# Patient Record
Sex: Female | Born: 1935 | Race: White | Hispanic: No | Smoking: Never smoker
Health system: Southern US, Community
[De-identification: ages and names within clinical notes are randomized; demographics above are authoritative.]

## PROBLEM LIST (undated history)

## (undated) DIAGNOSIS — I10 Essential (primary) hypertension: Secondary | ICD-10-CM

## (undated) DIAGNOSIS — E7800 Pure hypercholesterolemia, unspecified: Secondary | ICD-10-CM

## (undated) DIAGNOSIS — K219 Gastro-esophageal reflux disease without esophagitis: Secondary | ICD-10-CM

## (undated) DIAGNOSIS — R072 Precordial pain: Secondary | ICD-10-CM

## (undated) DIAGNOSIS — Z8249 Family history of ischemic heart disease and other diseases of the circulatory system: Secondary | ICD-10-CM

## (undated) DIAGNOSIS — J309 Allergic rhinitis, unspecified: Secondary | ICD-10-CM

## (undated) DIAGNOSIS — Z8719 Personal history of other diseases of the digestive system: Secondary | ICD-10-CM

## (undated) DIAGNOSIS — C50919 Malignant neoplasm of unspecified site of unspecified female breast: Secondary | ICD-10-CM

## (undated) DIAGNOSIS — I2510 Atherosclerotic heart disease of native coronary artery without angina pectoris: Secondary | ICD-10-CM

## (undated) DIAGNOSIS — I509 Heart failure, unspecified: Secondary | ICD-10-CM

## (undated) DIAGNOSIS — I252 Old myocardial infarction: Secondary | ICD-10-CM

## (undated) DIAGNOSIS — E876 Hypokalemia: Secondary | ICD-10-CM

## (undated) DIAGNOSIS — I82409 Acute embolism and thrombosis of unspecified deep veins of unspecified lower extremity: Secondary | ICD-10-CM

## (undated) DIAGNOSIS — H3530 Unspecified macular degeneration: Secondary | ICD-10-CM

## (undated) HISTORY — PX: CATARACT EXTRACTION, BILATERAL: SHX1313

## (undated) HISTORY — DX: Atherosclerotic heart disease of native coronary artery without angina pectoris: I25.10

## (undated) HISTORY — DX: Gastro-esophageal reflux disease without esophagitis: K21.9

## (undated) HISTORY — DX: Hypokalemia: E87.6

## (undated) HISTORY — PX: ABDOMINAL HYSTERECTOMY: SHX81

## (undated) HISTORY — DX: Allergic rhinitis, unspecified: J30.9

## (undated) HISTORY — DX: Family history of ischemic heart disease and other diseases of the circulatory system: Z82.49

## (undated) HISTORY — DX: Malignant neoplasm of unspecified site of unspecified female breast: C50.919

## (undated) HISTORY — PX: LUMBAR LAMINECTOMY: SHX95

## (undated) HISTORY — DX: Pure hypercholesterolemia, unspecified: E78.00

## (undated) HISTORY — DX: Acute embolism and thrombosis of unspecified deep veins of unspecified lower extremity: I82.409

## (undated) HISTORY — PX: OTHER SURGICAL HISTORY: SHX169

## (undated) HISTORY — DX: Essential (primary) hypertension: I10

## (undated) HISTORY — DX: Unspecified macular degeneration: H35.30

## (undated) HISTORY — DX: Old myocardial infarction: I25.2

## (undated) HISTORY — DX: Precordial pain: R07.2

---

## 2000-04-15 HISTORY — PX: CARDIAC CATHETERIZATION: SHX172

## 2000-06-23 ENCOUNTER — Inpatient Hospital Stay (HOSPITAL_COMMUNITY): Admission: AD | Admit: 2000-06-23 | Discharge: 2000-06-25 | Payer: Self-pay | Admitting: Internal Medicine

## 2000-06-24 ENCOUNTER — Encounter: Payer: Self-pay | Admitting: Internal Medicine

## 2000-06-25 ENCOUNTER — Encounter: Payer: Self-pay | Admitting: Internal Medicine

## 2000-07-14 DIAGNOSIS — I2510 Atherosclerotic heart disease of native coronary artery without angina pectoris: Secondary | ICD-10-CM

## 2000-07-14 HISTORY — DX: Atherosclerotic heart disease of native coronary artery without angina pectoris: I25.10

## 2002-10-25 ENCOUNTER — Ambulatory Visit (HOSPITAL_COMMUNITY): Admission: RE | Admit: 2002-10-25 | Discharge: 2002-10-25 | Payer: Self-pay | Admitting: Internal Medicine

## 2004-04-15 DIAGNOSIS — I82409 Acute embolism and thrombosis of unspecified deep veins of unspecified lower extremity: Secondary | ICD-10-CM

## 2004-04-15 DIAGNOSIS — C50919 Malignant neoplasm of unspecified site of unspecified female breast: Secondary | ICD-10-CM

## 2004-04-15 HISTORY — DX: Acute embolism and thrombosis of unspecified deep veins of unspecified lower extremity: I82.409

## 2004-04-15 HISTORY — DX: Malignant neoplasm of unspecified site of unspecified female breast: C50.919

## 2005-09-17 ENCOUNTER — Ambulatory Visit (HOSPITAL_COMMUNITY): Admission: RE | Admit: 2005-09-17 | Discharge: 2005-09-17 | Payer: Self-pay | Admitting: Internal Medicine

## 2005-09-17 ENCOUNTER — Encounter (INDEPENDENT_AMBULATORY_CARE_PROVIDER_SITE_OTHER): Payer: Self-pay | Admitting: *Deleted

## 2005-09-17 ENCOUNTER — Ambulatory Visit: Payer: Self-pay | Admitting: Internal Medicine

## 2005-09-17 HISTORY — PX: COLONOSCOPY: SHX174

## 2009-04-18 ENCOUNTER — Encounter: Payer: Self-pay | Admitting: Cardiology

## 2009-04-18 ENCOUNTER — Ambulatory Visit (HOSPITAL_COMMUNITY): Admission: RE | Admit: 2009-04-18 | Discharge: 2009-04-18 | Payer: Self-pay | Admitting: Urology

## 2010-02-13 HISTORY — PX: TOTAL HIP ARTHROPLASTY: SHX124

## 2010-02-28 ENCOUNTER — Encounter: Payer: Self-pay | Admitting: Physician Assistant

## 2010-03-01 ENCOUNTER — Ambulatory Visit: Payer: Self-pay | Admitting: Cardiology

## 2010-03-01 ENCOUNTER — Encounter: Payer: Self-pay | Admitting: Physician Assistant

## 2010-03-05 ENCOUNTER — Encounter: Payer: Self-pay | Admitting: Physician Assistant

## 2010-03-14 ENCOUNTER — Encounter: Payer: Self-pay | Admitting: Physician Assistant

## 2010-03-29 ENCOUNTER — Encounter: Payer: Self-pay | Admitting: Physician Assistant

## 2010-03-30 ENCOUNTER — Encounter: Payer: Self-pay | Admitting: Physician Assistant

## 2010-04-01 ENCOUNTER — Encounter: Payer: Self-pay | Admitting: Physician Assistant

## 2010-04-02 ENCOUNTER — Encounter: Payer: Self-pay | Admitting: Physician Assistant

## 2010-04-03 ENCOUNTER — Ambulatory Visit: Payer: Self-pay | Admitting: Physician Assistant

## 2010-04-04 ENCOUNTER — Encounter: Payer: Self-pay | Admitting: Physician Assistant

## 2010-04-27 ENCOUNTER — Encounter: Payer: Self-pay | Admitting: Physician Assistant

## 2010-04-27 DIAGNOSIS — I238 Other current complications following acute myocardial infarction: Secondary | ICD-10-CM | POA: Insufficient documentation

## 2010-04-27 DIAGNOSIS — I2510 Atherosclerotic heart disease of native coronary artery without angina pectoris: Secondary | ICD-10-CM | POA: Insufficient documentation

## 2010-04-27 DIAGNOSIS — D649 Anemia, unspecified: Secondary | ICD-10-CM | POA: Insufficient documentation

## 2010-05-01 ENCOUNTER — Encounter: Payer: Self-pay | Admitting: Physician Assistant

## 2010-05-17 NOTE — Assessment & Plan Note (Signed)
Summary: [facility]-POST [facility] PER [facility] DAY WITH [facility]   Visit Type:  Follow-up Primary Provider:  Dr. Samuel Jester   History of Present Illness: patient presents for posthospital followup.  She was referred to Dr. Andee Lineman and myself back in November, for question of possible small LV apical thrombus. She had presented status post fall, with fracture of the right hip, and underwent surgery with no complications. Nevertheless, a 2-D echo was obtained, indicating normal LVF (EF 50-55% Preventive Screening-Counseling & Management  Alcohol-Tobacco     Smoking Status: never  Current Medications (verified): 1)  Ativan 0.5 Mg Tabs (Lorazepam) .... Take 1 Tablet By Mouth Two Times A Day As Needed 2)  Potassium Chloride Crys Cr 20 Meq Cr-Tabs (Potassium Chloride Crys Cr) .... Take 1 Tablet By Mouth Once A Day 3)  Furosemide 20 Mg Tabs (Furosemide) .... Take One Tablet By Mouth Daily. 4)  Vitamin D 1000 Unit Tabs (Cholecalciferol) .... Take 1 Tablet By Mouth Once A Day 5)  Multivitamins  Tabs (Multiple Vitamin) .... Take 1 Tablet By Mouth Once A Day 6)  Colace 100 Mg Caps (Docusate Sodium) .... Take 1 Capsule By Mouth Two Times A Day 7)  Miralax  Powd (Polyethylene Glycol 3350) .Marland KitchenMarland KitchenMarland Kitchen 17 Grams By Mouth Once Daily. 8)  Oscal 500/200 D-3 500-200 Mg-Unit Tabs (Calcium Carbonate-Vitamin D) .... Take 1 Tablet By Mouth Once Daily 9)  Vitamin C 500 Mg Tabs (Ascorbic Acid) .... Take 1 Tablet By Mouth Once A Day 10)  Ocuvite Preservision  Tabs (Multiple Vitamins-Minerals) .... Take 2 Tablets By Mouth Once Daily. 11)  Restora .... Take 1 Capsule By Mouth Once A Day 12)  Megestrol Acetate 40 Mg/ml Susp (Megestrol Acetate) .... 3 Tsp By Mouth Once Daily. 13)  Prednisolone Acetate 1 % Susp (Prednisolone Acetate) .Marland Kitchen.. 1 Drop At Bedtime (L) Eye  Allergies: No Known Drug Allergies  Comments:  Nurse/Medical Assistant: The patient's medications were reviewed with the patient and were updated in the Medication  List. Pt brought a list of medications to office visit.  Cyril Loosen, RN, BSN (April 27, 2010 1:51 PM)  Past History:  Past Medical History: CAD... NSTEMI, in 2002, treated medically... 60% RCA stenosis by catheterization; normal LVF... negative nuclear study, 1/03 Apical thrombus LLE DVT, in 2006, on tamoxifen... associated superficial vein thrombosis Breast cancer, in 2006... status post lumpectomy, tamoxifen Macular degeneration Pyelonephritis  Past Surgical History: right hip surgery, 11/11 Lumbar laminectomy Bilateral cataract surgery Left corneal implant Hysterectomy  Social History: Smoking Status:  never  Review of Systems       No fevers, chills, hemoptysis, dysphagia, melena, hematocheezia, hematuria, rash, claudication, orthopnea, pnd, pedal edema. All other systems negative.   Vital Signs:  Patient profile:   77 year old female Height:      62 inches Weight:      96.25 pounds BMI:     17.67 Pulse rate:   90 / minute BP sitting:   141 / 84  (left arm) Cuff size:   regular  Vitals Entered By: Cyril Loosen, RN, BSN (April 27, 2010 1:44 PM) Comments Follow up office visit. H/O CAD   Physical Exam  Additional Exam:  GEN: 77 year old female, no distress HEENT: NCAT,PERRLA,EOMI NECK: palpable pulses, no bruits; no JVD; no TM LUNGS: CTA bilaterally HEART: RRR (S1S2); no significant murmurs; no rubs; no gallops ABD: soft, NT; intact BS EXT: intact distal pulses; no edema SKIN: warm, dry MUSC: no obvious deformity NEURO: A/O (x3)  EKG  Procedure date:  04/27/2010  Findings:      normal sinus rhythm at 88 bpm; normal axis; small T-wave inversion in inferolateral leads  Impression & Recommendations:  Problem # 1:  MURAL THROMBUS (ICD-429.79)  we'll repeat 2-D echocardiogram for reassessment of small apical thrombus. If still present, will need to strongly consider reanticoagulation with Coumadin for a full 3 months. Will schedule early  clinic follow up with myself and Dr. Andee Lineman in one month, for review of study result and further recommendations.  Problem # 2:  CAD (ICD-414.00)  clinically stable on current regimen. Continue medical management, with no current indication for an ischemia evaluation, pending review of the echocardiogram result  Problem # 3:  ANEMIA (ICD-285.9)  reevaluate with CBC, iron profile, and ferritin.  Other Orders: EKG w/ Interpretation (93000) 2-D Echocardiogram (2D Echo) T-CBC No Diff (04540-98119) T-Ferritin (14782-95621) T-Iron Binding Capacity (TIBC) ([address])  Patient Instructions: 1)  2D Echo 2)  Labs:  CBC, Ferritin, & TIBC 3)  Follow up in  1 month

## 2010-05-17 NOTE — Consult Note (Signed)
Summary: [facility]OL[facility] CONSULT/ [facility]  [facility] CONSULT/ [facility]   Imported By: Zachary George 04/27/2010 13:24:24  _____________________________________________________________________  External Attachment:    Type:   Image     Comment:   External Document

## 2010-05-17 NOTE — Letter (Signed)
Summary: [facility] D/C DR.HASANAJ  [facility] D/C DR.HASANAJ   Imported By: Zachary George 04/27/2010 13:22:47  _____________________________________________________________________  External Attachment:    Type:   Image     Comment:   External Document

## 2010-05-17 NOTE — Medication Information (Signed)
Summary: [facility] D/C MEDICATION ORDERS  [facility] D/C MEDICATION ORDERS   Imported By: Zachary George 04/27/2010 13:25:45  _____________________________________________________________________  External Attachment:    Type:   Image     Comment:   External Document

## 2010-05-17 NOTE — Cardiovascular Report (Signed)
Summary: Cardiac Cath   Cardiac Cath   Imported By: Zachary George 04/27/2010 13:27:03  _____________________________________________________________________  External Attachment:    Type:   Image     Comment:   External Document

## 2010-05-17 NOTE — Letter (Signed)
Summary: [facility] D/C DR. Bea Laura  [facility] D/C DR. GAIL GERENA   Imported By: Zachary George 04/27/2010 13:35:21  _____________________________________________________________________  External Attachment:    Type:   Image     Comment:   External Document

## 2010-05-17 NOTE — Letter (Signed)
Summary: [facility] D/C   [facility] D/C   Imported By: Zachary George 04/27/2010 13:27:59  _____________________________________________________________________  External Attachment:    Type:   Image     Comment:   External Document

## 2010-06-13 ENCOUNTER — Ambulatory Visit (INDEPENDENT_AMBULATORY_CARE_PROVIDER_SITE_OTHER): Payer: MEDICARE | Admitting: Cardiology

## 2010-06-13 ENCOUNTER — Encounter: Payer: Self-pay | Admitting: Cardiology

## 2010-06-13 DIAGNOSIS — G4700 Insomnia, unspecified: Secondary | ICD-10-CM | POA: Insufficient documentation

## 2010-06-13 DIAGNOSIS — I5032 Chronic diastolic (congestive) heart failure: Secondary | ICD-10-CM

## 2010-06-13 DIAGNOSIS — I2510 Atherosclerotic heart disease of native coronary artery without angina pectoris: Secondary | ICD-10-CM

## 2010-06-21 NOTE — Assessment & Plan Note (Signed)
Summary: 1 month fu-agh   Visit Type:  Follow-up Primary Provider:  Dr. Samuel Jester   History of Present Illness: The patient had a recent echocardiogram done in January of 2012.  Ejection fraction was 50 to 55%.  There was evidence of mild diastolic dysfunction.  No thrombus was visualized in the left ventricle.  There was mild mitral regurgitation.  The patient also had normal iron studies with a normal CBC. The patient has a history of coronary artery disease and non-ST elevation myocardial infarction in 2002.  She 60% RCA stenosis and was treated medically.  There has been question of an apical thrombus.  She also has a history of DVT in 2006 on tamoxifen and history of breast cancer. As outlined above follow-up echocardiogram does not demonstrate any evidence of LV thrombus.  The patient's coronary artery disease has been clinically stable. The patient reports no significant shortness of breath or chest pain.  She had some difficulties with insomnia and has been started on Remeron.  This seems to be working for her.  She ran out of her Lasix but was still taking her potassium pills.  She does not appear to have heart failure symptoms.  She denies any orthopnea PND or lower extremity edema.  Preventive Screening-Counseling & Management  Alcohol-Tobacco     Smoking Status: never  Current Medications (verified): 1)  Ativan 0.5 Mg Tabs (Lorazepam) .... Take 1 Tablet By Mouth Two Times A Day As Needed 2)  Potassium Chloride Crys Cr 20 Meq Cr-Tabs (Potassium Chloride Crys Cr) .... May Take One Tab As Needed Swelling, No More Than One Tab in 24 Hour Period (Take With Lasix) 3)  Vitamin D 1000 Unit Tabs (Cholecalciferol) .... Take 1 Tablet By Mouth Once A Day 4)  Multivitamins  Tabs (Multiple Vitamin) .... Take 1 Tablet By Mouth Once A Day 5)  Colace 100 Mg Caps (Docusate Sodium) .... Take 1 Capsule By Mouth Two Times A Day 6)  Miralax  Powd (Polyethylene Glycol 3350) .Marland KitchenMarland KitchenMarland Kitchen 17 Grams By Mouth  Once Daily. 7)  Oscal 500/200 D-3 500-200 Mg-Unit Tabs (Calcium Carbonate-Vitamin D) .... Take 1 Tablet By Mouth Once Daily 8)  Vitamin C 500 Mg Tabs (Ascorbic Acid) .... Take 1 Tablet By Mouth Once A Day 9)  Ocuvite Preservision  Tabs (Multiple Vitamins-Minerals) .... Take 2 Tablets By Mouth Once Daily. 10)  Restora .... Take 1 Capsule By Mouth Once A Day 11)  Prednisolone Acetate 1 % Susp (Prednisolone Acetate) .Marland Kitchen.. 1 Drop At Bedtime (L) Eye 12)  Remeron 15 Mg Tabs (Mirtazapine) .... Take 1 Tab By Mouth At Bedtime 13)  Hydrocodone-Acetaminophen 5-500 Mg Tabs (Hydrocodone-Acetaminophen) .... Take 1 Tablet By Mouth At Bedtime (And As Needed) 14)  Lasix 20 Mg Tabs (Furosemide) .... May Take One Tab As Needed Swelling, No More Than One Tab in 24 Hour Period (Take With Potassium)  Allergies: No Known Drug Allergies  Comments:  Nurse/Medical Assistant: The patient's medications were reviewed with the patient and were updated in the Medication List. Pt brought a list of medications to office visit and verbally confirmed medications.  Cyril Loosen, RN, BSN (June 13, 2010 9:19 AM)  Past History:  Past Medical History: Last updated: 04/27/2010 CAD... NSTEMI, in 2002, treated medically... 60% RCA stenosis by catheterization; normal LVF... negative nuclear study, 1/03 Apical thrombus LLE DVT, in 2006, on tamoxifen... associated superficial vein thrombosis Breast cancer, in 2006... status post lumpectomy, tamoxifen Macular degeneration Pyelonephritis  Past Surgical History: Last updated: 04/27/2010 right  hip surgery, 11/11 Lumbar laminectomy Bilateral cataract surgery Left corneal implant Hysterectomy  Risk Factors: Smoking Status: never (06/13/2010)  Review of Systems  The patient denies fatigue, malaise, fever, weight gain/loss, vision loss, decreased hearing, hoarseness, chest pain, palpitations, shortness of breath, prolonged cough, wheezing, sleep apnea, coughing up blood,  abdominal pain, blood in stool, nausea, vomiting, diarrhea, heartburn, incontinence, blood in urine, muscle weakness, joint pain, leg swelling, rash, skin lesions, headache, fainting, dizziness, depression, anxiety, enlarged lymph nodes, easy bruising or bleeding, and environmental allergies.    Vital Signs:  Patient profile:   77 year old female Height:      62 inches Weight:      93.75 pounds BMI:     17.21 Pulse rate:   78 / minute BP sitting:   141 / 75  (left arm) Cuff size:   regular  Vitals Entered By: Cyril Loosen, RN, BSN (June 13, 2010 9:10 AM) Comments No cardiac complaints. Pt was taking Lasix 20mg  but ran out last Friday. She did not have a refill so she stopped. She still has potassium (enough through the end of this week). She has continued potassium. Cyril Loosen, RN, BSN  June 13, 2010 9:16 AM    Physical Exam  Additional Exam:  GEN: 77 year old female, no distress HEENT: NCAT,PERRLA,EOMI NECK: palpable pulses, no bruits; no JVD; no TM LUNGS: CTA bilaterally HEART: RRR (S1S2); no significant murmurs; no rubs; no gallops ABD: soft, NT; intact BS EXT: intact distal pulses; no edema SKIN: warm, dry MUSC: no obvious deformity NEURO: A/O (x3)     Impression & Recommendations:  Problem # 1:  CAD (ICD-414.00) coronary artery disease: Treated medically status post non-ST elevation myocardial infarction and 60% RCA stenosis in 2003.  No recurrent chest pain and no further ischemia workup necessary. normal LV function: Echocardiogram 2012.  No wall motion abnormalities.  Mild diastolic dysfunction. LV thrombus: No evidence of LV thrombus by echocardiogram.  Problem # 2:  MURAL THROMBUS (ICD-429.79) LV thrombus: No evidence of LV thrombus by echocardiogram. History of DVT tamoxifen discontinued  Problem # 3:  CHRONIC DIASTOLIC HEART FAILURE (ICD-428.32)  chronic diastolic heart failure: Stable I refilled Lasix and potassium and the patient can take this  on a p.r.n. basis up to once a day if shortness of breath or lower extremity edema.  The following medications were removed from the medication list:    Furosemide 20 Mg Tabs (Furosemide) .Marland Kitchen... Take one tablet by mouth daily. Her updated medication list for this problem includes:    Lasix 20 Mg Tabs (Furosemide) ..... May take one tab as needed swelling, no more than one tab in 24 hour period (take with potassium)  Problem # 4:  INSOMNIA (ICD-780.52) Insomnia improved with combination of Remeron and Ativan.  Patient Instructions: 1)  Your physician recommends that you continue on your current medications as directed. Please refer to the Current Medication list given to you today. 2)  Follow up in  6 months Prescriptions: ATIVAN 0.5 MG TABS (LORAZEPAM) Take 1 tablet by mouth two times a day as needed  #60 x 1   Entered by:   Hoover Brunette, LPN   Authorized by:   Lewayne Bunting, MD, [facility]   Signed by:   Hoover Brunette, LPN on 16/01/9603   Method used:   Print then Give to Patient   RxID:   [id number] LASIX 20 MG TABS (FUROSEMIDE) may take one tab as needed swelling, no more than one tab in 24 hour period (  take with Potassium)  #30 x 3   Entered by:   Hoover Brunette, LPN   Authorized by:   Lewayne Bunting, MD, [facility]   Signed by:   Hoover Brunette, LPN on 16/01/9603   Method used:   Electronically to        [facility]* (retail)       [address]       [place], [place]  [address]       Ph: [phone]       Fax: [phone]   RxID:   [id number] POTASSIUM CHLORIDE CRYS CR 20 MEQ CR-TABS (POTASSIUM CHLORIDE CRYS CR) may take one tab as needed swelling, no more than one tab in 24 hour period (take with Lasix)  #30 x 3   Entered by:   Hoover Brunette, LPN   Authorized by:   Lewayne Bunting, MD, [facility]   Signed by:   Hoover Brunette, LPN on 02/09/2535   Method used:   Electronically to        [facility]* (retail)       [address]       [place], [place]  [address]       Ph:  [phone]       Fax: [phone]   RxID:   [id number]

## 2010-08-31 NOTE — Op Note (Signed)
NAME:  Kristen Rodriguez, Kristen Rodriguez                             ACCOUNT NO.:  [id number]   MEDICAL RECORD NO.:  [id number]                   PATIENT TYPE:  AMB   LOCATION:  DAY                                  FACILITY:  [facility]   PHYSICIAN:  Lionel December, M.D.                 DATE OF BIRTH:  05-Oct-1923   DATE OF PROCEDURE:  10/25/2002  DATE OF DISCHARGE:                                 OPERATIVE REPORT   PROCEDURE:  Total colonoscopy.   INDICATIONS FOR PROCEDURE:  Kristen Rodriguez is a 77 year old Caucasian female who has  chronic constipation.  She is undergoing colonoscopy primarily for screening  purposes.  The procedure was reviewed with the patient, and informed consent  was obtained.  The procedure was performed in two stages as described below.   PREOPERATIVE MEDICATIONS:  Demerol 50 mg IV, Versed 5 mg IV in divided  doses.   INSTRUMENT USED:  Olympus video system.   FINDINGS:  The procedure was performed in the endoscopy suite.  The  patient's vital signs and O2 saturations were monitored during the procedure  and remained stable.  The patient was placed in the left lateral recumbent  position and rectal examination performed.  No abnormality noted on external  or digital exam.  The scope was placed into the rectum and advanced to the  region of the sigmoid colon.  She had a large amount of formed stool, but I  was not able to dislodge the stool or pass the scope in the mucosa proximal  to it.  The endoscope was withdrawn, and the patient was given two tap water  enemas and brought back.  When she came back, she told me that she had chest  pain lasting for a few minutes after she was given second tap water enema.  She did not have any diaphoresis or lightheadedness.  She did not want to  have an EKG.  By the time we started the procedure again, she was pain-free.  Once again, rectal examination was performed and was normal.  The scope was  placed into the rectum and advanced into the region of the  sigmoid colon.  She still had some stool but it was bits and pieces, and I was able to  advance the scope into the cecum.  Overall, the preparation was felt to be  fair.  The appendiceal stump was well-seen and photographed for the record.  The ileocecal valve was normal.  There were two small polyps in the cecum.  Because of the quality of the prep, I opted to remove these via cold biopsy.  Both of these were sized and submitted in one container.  No other polyps  were noted.  Multiple diverticula were noted at the descending and sigmoid  colon.  The rectal mucosa was normal.  The scope was retroflexed to examine  the anorectal junction which was unremarkable.  The endoscope was  straightened and withdrawn.  The patient tolerated the procedure well.   FINAL DIAGNOSES:  1. Left colonic diverticulosis.  2. Two small cecal polyps that were ablated via cold biopsy.   RECOMMENDATIONS:  She will resume her usual medications.  Will start her on  lactulose one to two tablespoons full at bedtime, and she will continue  Benefiber at 4 g daily.   Since she did have an episode of chest pain, we will get a troponin level  before she is discharged from this facility.   I will be contacting the patient with the biopsy results and further  recommendations.                                               Lionel December, M.D.    NR/MEDQ  D:  10/25/2002  T:  10/25/2002  Job:  [id number]   cc:   Kristen Rodriguez

## 2010-08-31 NOTE — Cardiovascular Report (Signed)
[place]. [facility]  Patient:    Kristen Rodriguez, Kristen Rodriguez                          MRN: [id number] Proc. Date: 06/24/00 Adm. Date:  [id number] Attending:  Pricilla Riffle CC:         Wende Crease, M.D.             [facility] [place]             Cardiac Catheterization Laboratory                        Cardiac Catheterization  PROCEDURES PERFORMED:  Left heart catheterization with coronary angiography and left ventriculography.  INDICATIONS:  Ms. Hoelting is a 77 year old woman, who presented to [facility] with chest pain and elevated troponins.  She was referred for cardiac catheterization.  DESCRIPTION OF PROCEDURE:  A 6 French sheath was placed in the right femoral artery.  Standard Judkins 6 French catheters were utilized.  Contrast was Omnipaque.  There were no complications.  RESULTS:  HEMODYNAMICS:  Left ventricular pressure 148/8.  Aortic pressure 148/68. There was no aortic valve gradient.  LEFT VENTRICULOGRAM:  There is possibly very mild anterolateral hypokinesis of the anterior wall, otherwise, normal wall motion.  Ejection fraction estimated at greater than 60%.  CORONARY ARTERIOGRAPHY:  (Right dominant).  Left main:  Left main has a distal 20% stenosis.  Left anterior descending:  The left anterior descending artery has a 30% stenosis in the proximal vessel and tandem 30% stenoses in the mid vessel. The LAD gives rise to a normal sized first diagonal and a small second diagonal.  Left circumflex:  The left circumflex is a relatively small vessel giving rise to a normal sized first marginal and a small second marginal.  The circumflex is free of angiographic disease.  Right coronary artery:  The right coronary artery has a 20% stenosis in the proximal vessel.  The mid vessel has a 30% followed by a 60% stenosis at the acute margin.  The distal vessel has a tubular 70% stenosis before the posterior descending artery.  The distal right  coronary artery gives rise to a normal sized posterior descending artery and normal first posterolateral and a small second posterolateral.  IMPRESSIONS: 1. Preserved left ventricular systolic function. 2. One-vessel coronary artery disease involving the right coronary artery    as described which appears to be of moderate severity.  There is no    obvious culprit lesion for the patients cardiac enzyme elevation.  RECOMMENDATIONS:  Medical therapy with aggressive risk factor modification. DD:  06/24/00 TD:  06/24/00 Job: [id number]

## 2010-08-31 NOTE — Discharge Summary (Signed)
[place]. [facility]  Patient:    Kristen Rodriguez, Kristen Rodriguez                         MRN: [id number] Adm. Date:  06/23/00 Disc. Date: 06/25/00 Attending:  Rollene Rotunda, M.D. [facility] Dictator:   Gene Serpe, P.A. CC:         Dr. Wende Crease  [facility]   Referring Physician Discharge [place]  PROCEDURES:  Cardiac catheterization June 24, 2000.  REASON FOR ADMISSION:  [name] is a 77 year old female without prior history of heart disease who initially presented to [facility] for evaluation of chest discomfort.  She was seen in consultation by Dr. Daiva Nakayama and recommendation was to initiate aggressive medical management and transfer to [facility] for diagnostic cardiac catheterization.  Cardiac enzymes were abnormal and suggestive of a non-Q-wave MI.  Patient was placed on IIb/IIIa inhibitor and heparin and loaded with Plavix in addition to aspirin.  Peak troponin I level 0.97 with negative total CPKs and marginally elevated MB fraction.  Lipid profile revealed cholesterol 257, HDL 75, and LDL 150 - patient was also started on Zocor.  Please refer to dictated consult note for full details.  LABORATORY DATA AT [facility]:  Normal CBC.  INR 0.9.  HOSPITAL COURSE:  Following transfer from [facility] patient was maintained on both heparin and Integrilin and was scheduled for diagnostic coronary angiography the following day.  Procedure performed March 12 by Dr. Gerri Spore (see catheterization report for full details) revealed moderately-severe single-vessel CAD (30-60% mid RCA) with otherwise normal circumflex and mild 30% LAD lesions.   Left ventriculogram revealed preserved LV function.  Dr. Gerri Spore found no obvious culprit lesion and recommended medical therapy and aggressive management of cardiac risk factors.  Patient will need follow-up lipids/LFT profile in six weeks since she was just started on Zocor.  MEDICATIONS AT  DISCHARGE: 1. Aspirin 81 mg q.d. 2. Lopressor 25 mg b.i.d. 3. Zocor 20 mg q.h.s. 4. Prilosec 20 mg q.d. 5. Nitrostat as directed.  INSTRUCTIONS:  ACTIVITY:  Refrain from any heavy lifting, driving x 2 days.  DIET:  Maintain a low fat/cholesterol diet.  WOUND CARE:  Call the office if there is any swelling/bleeding of the groin.  FOLLOW-UP:  Patient is instructed to follow up with Dr. Daiva Nakayama in the following 2-4 weeks at the [facility]  The office will call with a scheduled appointment.  DISCHARGE DIAGNOSES: 1. Non-Q-wave myocardial infarction.    a. Moderately-severe single-vessel coronary artery disease - cardiac       catheterization June 24, 2000.    b. Normal left ventricular function. 2. Dislipidemia. 3. Hiatal hernia/gastroesophageal reflux disease. 4. History of esophageal stricture - status post dilatation. DD:  06/25/00 TD:  06/25/00 Job: [id number]

## 2010-08-31 NOTE — Op Note (Signed)
NAME:  Kristen Rodriguez, Kristen Rodriguez                   ACCOUNT NO.:  [id number]   MEDICAL RECORD NO.:  [id number]          PATIENT TYPE:  AMB   LOCATION:  DAY                           FACILITY:  [facility]   PHYSICIAN:  Lionel December, M.D.    DATE OF BIRTH:  [birth year]   DATE OF PROCEDURE:  09/17/2005  DATE OF DISCHARGE:                                 OPERATIVE REPORT   PROCEDURE:  Colonoscopy.   INDICATIONS:  Kristen Rodriguez is 77 year old Caucasian female who has recently noted  change in her bowel habits and therefore sent over for colonoscopy by Dr.  Cleone Slim.  She has history of colonic polyps.  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  Family  history is negative for colorectal carcinoma but personal history is  positive for breast carcinoma and she remains in remission.  Procedure and  risks were reviewed with the patient, informed consent was obtained.   MEDS FOR CONSCIOUS SEDATION:  Demerol 25 mg IV, Versed 4 mg IV, in divided  dose.   FINDINGS:  Procedure performed in endoscopy suite.  The patient's vital  signs and O2 sat were monitored during procedure and remained stable.  The  patient was placed left lateral position and rectal examination performed.  No abnormality noted on external or digital exam.  Olympus videoscope was  placed in rectum and advanced under vision into sigmoid colon beyond.  Preparation was satisfactory.  She had multiple diverticula at sigmoid colon  and few more above that all the way to the cecum.  Very redundant hepatic  flexure.  The patient was turned on the right side in order to pass the  scope into cecum which was identified by appendiceal stump and ileocecal  valve.  Pictures taken for the record.  There was no polyp noted at cecum.  As the scope was withdrawn colonic mucosa was carefully examined.  There was  a single polyp at splenic flexure which was ablated via cold biopsy.  Another tiny polyp transverse colon seen on  the way in but not on the way  out.  This was suspicious for hyperplastic polyp.  Mucosa rest of the colon  was normal.  Rectal mucosa similarly was normal.  Scope was retroflexed to  examine anorectal junction and small hemorrhoids were noted below the  dentate line.  Endoscope was then withdrawn.  The patient tolerated the  procedure well.   FINAL DIAGNOSIS:  Pan colonic diverticulosis.  Most of the diverticula,  however, at sigmoid colon.   Small polyp ablated via cold biopsy from the splenic flexure.  External  hemorrhoids.   No evidence of colonic stricture.   RECOMMENDATIONS:  She will continue high-fiber diet and MiraLax but add  fiber supplement 3-4 grams per day.  I will be contacting the patient with  biopsy results.  She may consider next exam in five years as long as she  remains in good health.      Lionel December, M.D.  Electronically Signed     NR/MEDQ  D:  09/17/2005  T:  09/17/2005  Job:  [id number]   cc:   Lynett Fish, M.D.   Dr. Wende Crease, [place]

## 2010-08-31 NOTE — H&P (Signed)
NAME:  Emory, Carlisle Kirtland Bouchard                            ACCOUNT NO.:  [id number]   MEDICAL RECORD NO.:  [id number]                  PATIENT TYPE:   LOCATION:                                       FACILITY:   PHYSICIAN:  Lionel December, M.D.                 DATE OF BIRTH:  1924/01/09   DATE OF ADMISSION:  DATE OF DISCHARGE:                                HISTORY & PHYSICAL   PRESENTING COMPLAINT:  Epigastric pain and constipation.   HISTORY OF PRESENT ILLNESS:  Kristen Rodriguez is a 77 year old Caucasian female patient  of Dr. Doyne Keel who is well known to me from previous evaluation; however,  she has not been seen since October 2001.  She has history of chronic GERD  prior to antireflux surgery, essentially undone.  She was advised to stay on  PPI therapy chronically.  She was on Prilosec, which she stopped a few  months back.  She started to have epigastric and chest pain.  She was seen  by Dr. Doyne Keel and begun on Protonix.  She states that all of these symptoms  have resolved.  She does complain of constipation.  She believes it is due  to diverticulitis; however, she has never been diagnosed with  diverticulosis.  She always has hard stools.  Previously, she has been  advised a bulk-forming agent but she does not take anything now.  She has  never been screened for colorectal carcinoma and appears to be interested.  She denies melena, rectal bleeding, anorexia, or weight loss.   MEDICATIONS:  1. Isosorbide 30 mg daily.  2. Protonix 40 mg q.a.m.  3. Altace 5 mg daily.  4. Xanax 0.5 mg q.h.s.  5. Calcium with vitamin D 600 mg daily.  6. Aspirin 81 mg daily.  7. Centrum Silver.  8. Fish oil.  9. Allegra-D daily.  10.      Tylenol p.r.n.   PAST MEDICAL HISTORY:  1. Chronic gastroesophageal reflux disease dating back to over 30 years ago.     She had a Nissen fundoplication in 1974 at [facility] which provided relief     for perhaps 20 years.  She began to have gastroesophageal reflux disease   symptoms in the late 1990s.  She has had her esophagus stretched in 1997,     1998, and more recently October 2001.  She has moderate sliding hiatal     hernia and a very loose rub based on the EGD of October 2001.  She also     had enteral scarring.  Her H. pylori serology has been negative.  2. Peptic ulcer disease in the past was felt to be secondary to nonsteroidal     anti-inflammatory drug use.  3. She has had barium in the past but never had a colonoscopy.  4. She has had hysterectomy.  5. Lumbar spinal laminectomy.  6. In 1996, she had left corneal transplant  for a keratoconus.  7. She had right cataract extraction earlier this year.  8. She also had a small lesion removed from the left side of her face which     was some type of carcinoma.   ALLERGIES:  PENICILLIN and SULFA causing rash.   FAMILY HISTORY:  Negative for colon carcinoma.   SOCIAL HISTORY:  She is a widow.  She is a retired Comptroller.  She has never  smoked cigarettes and does not drink alcohol.   PHYSICAL EXAMINATION:  GENERAL:  A pleasant, well-developed, thin Caucasian  female who is in no acute distress.  VITAL SIGNS:  She weighs 118 pounds.  She is 5 feet 3 inches tall.  Pulse 74  per minute, blood pressure 130/70.  She is afebrile.  HEENT:  Conjunctiva is pink.  Sclera is nonicteric.  Oropharyngeal mucosa is  normal.  She has partial lower and complete upper plates.  NECK:  Without masses or thyromegaly.  CARDIAC:  Regular rhythm.  Normal S1, S2.  No murmur or gallop noted.  LUNGS:  Clear to auscultation.  ABDOMEN:  Flat and soft.  Sigmoid colon is palpable but nontender.  No  organomegaly or masses.  RECTAL:  Exam is deferred.  EXTREMITIES:  She does not have peripheral edema or clubbing.   ASSESSMENT:  1. Chronic gastroesophageal reflux disease.  Symptoms are well controlled     with antireflux measures and proton pump inhibitor.  As I have told her     in the past, she is better off taking a  proton pump inhibitor     chronically.  She has a moderate-sized sliding hiatal hernia.  She has     also found out that her symptoms relapse off therapy.  She has not     experienced any side effects with Protonix.  2. Chronic constipation.  I feel this is perhaps due to colonic dysmotility.     She will benefit from bulk-forming agents and a stool softener but she     also needs to have colonoscopy primarily for screening purposes.   RECOMMENDATIONS:  1. She will continue Protonix 40 mg q.a.m. chronically.  She has 11 more     refills on this prescription.  2. Benefiber or equivalent 4 g daily.  3. Colace two tablets at bedtime.  4. Colonoscopy will be scheduled at [facility] in 4-6 weeks from     now.  If she is still having bowel problems by then, we will switch her     to MiraLax or lactulose.                                               Lionel December, M.D.    NR/MEDQ  D:  09/15/2002  T:  09/15/2002  Job:  [id number]   cc:   Wende Crease, M.D.

## 2010-10-19 ENCOUNTER — Other Ambulatory Visit: Payer: Self-pay | Admitting: *Deleted

## 2010-10-19 MED ORDER — LORAZEPAM 0.5 MG PO TABS: 0.5000 mg | ORAL_TABLET | Freq: Two times a day (BID) | ORAL | Status: AC

## 2011-01-21 ENCOUNTER — Encounter: Payer: Self-pay | Admitting: Cardiology

## 2011-01-21 ENCOUNTER — Ambulatory Visit (INDEPENDENT_AMBULATORY_CARE_PROVIDER_SITE_OTHER): Payer: 59 | Admitting: Cardiology

## 2011-01-21 VITALS — BP 145/78 | HR 73 | Ht 62.0 in | Wt 94.4 lb

## 2011-01-21 DIAGNOSIS — R5381 Other malaise: Secondary | ICD-10-CM

## 2011-01-21 DIAGNOSIS — D649 Anemia, unspecified: Secondary | ICD-10-CM

## 2011-01-21 DIAGNOSIS — R5383 Other fatigue: Secondary | ICD-10-CM

## 2011-01-21 DIAGNOSIS — I509 Heart failure, unspecified: Secondary | ICD-10-CM

## 2011-01-21 DIAGNOSIS — I5032 Chronic diastolic (congestive) heart failure: Secondary | ICD-10-CM

## 2011-01-21 NOTE — Progress Notes (Signed)
History of present illness: The patient is a 77 year old female with a history of coronary artery disease, status post non-ST elevation myocardial infarction in 2002 with a 60% RCA stenosis treated medically. She also has a history of DVT on tamoxifen and breast cancer. She apparently is in remission. There was some question in the past of a possible LV thrombus but a followup echocardiogram could not confirm this finding. Her most recent echocardiogram was in 2012 earlier this year. She does have mild diastolic dysfunction by echocardiogram with no clinical evidence of heart failure/HFPEF. She appears to be doing well from a cardiac perspective. She denies any chest pain or shortness of breath. She does report significant weight loss and poor appetite.  Allergies, family history and social history: As documented in chart and reviewed  Medications: Documented and reviewed in chart.  Past medical history: Reviewed and see problem list below   Review of systems: No nausea or vomiting. No fever or chills. No melena hematochezia. No dysuria or frequency. No visual changes and no syncope.   Physical examination : Vital signs documented below General: Cachectic white female but in no distress HEENT: Normal carotid upstroke, no carotid bruits. No thyromegaly nonnodular thyroid. EOMI, PERRLA Lungs: Clear breath sounds bilaterally without any wheezing Heart: Regular rate and rhythm with normal S1-S2 and no murmur rubs or gallops. Abdomen: Soft and nontender Extremity exam: No cyanosis clubbing or edema Neurologic: Alert and oriented x3 and grossly nonfocal Psychiatric: Normal affect Vascular exam: Normal peripheral pulses

## 2011-01-21 NOTE — Assessment & Plan Note (Signed)
No recurrent chest pain. Continue medical therapy and risk factor reduction

## 2011-01-21 NOTE — Assessment & Plan Note (Signed)
Stable. No symptoms of heart failure. Continue current medical regimen

## 2011-01-21 NOTE — Assessment & Plan Note (Signed)
We will refer the patient for additional blood work particularly light of her fatigue and weakness. We will check a TSH and also recheck her CBC to make sure she is not anemic.

## 2011-01-21 NOTE — Patient Instructions (Signed)
Your physician recommends that you go to the [facility] for lab work for CBC, CMET, & TSH. If the results of your test are normal or stable, you will receive a letter.  If they are abnormal, the nurse will contact you by phone. Your physician wants you to follow up in: 6 months.  You will receive a reminder letter in the mail one-two months in advance.  If you don't receive a letter, please call our office to schedule the follow up appointment  

## 2011-01-22 ENCOUNTER — Telehealth: Payer: Self-pay | Admitting: *Deleted

## 2011-01-22 MED ORDER — MIRTAZAPINE 15 MG PO TABS: 15.0000 mg | ORAL_TABLET | Freq: Every day | ORAL | Status: DC

## 2011-01-22 MED ORDER — NAPROXEN SODIUM 220 MG PO TABS: 220.0000 mg | ORAL_TABLET | ORAL | Status: DC | PRN

## 2011-01-22 MED ORDER — HYDROCODONE-ACETAMINOPHEN 5-500 MG PO TABS: 1.0000 | ORAL_TABLET | Freq: Four times a day (QID) | ORAL | Status: AC | PRN

## 2011-01-22 MED ORDER — LORAZEPAM 0.5 MG PO TABS: 0.5000 mg | ORAL_TABLET | Freq: Every evening | ORAL | Status: DC | PRN

## 2011-01-22 NOTE — Telephone Encounter (Signed)
Daughter brought patient to OV on 10/8, but did not come back with her to see MD.  Patient had 2 med list & she could not tell me anything about them.  Daughter was back out front when pt discharged.  She did confirm the smaller med list.  Please review & advise if any changes need to be made

## 2011-01-23 NOTE — Telephone Encounter (Signed)
Reviewed

## 2011-08-15 ENCOUNTER — Encounter: Payer: Self-pay | Admitting: Cardiology

## 2011-08-15 ENCOUNTER — Ambulatory Visit (INDEPENDENT_AMBULATORY_CARE_PROVIDER_SITE_OTHER): Payer: 59 | Admitting: Cardiology

## 2011-08-15 VITALS — BP 163/79 | HR 86 | Ht 62.0 in | Wt 91.0 lb

## 2011-08-15 DIAGNOSIS — I2510 Atherosclerotic heart disease of native coronary artery without angina pectoris: Secondary | ICD-10-CM

## 2011-08-15 DIAGNOSIS — I238 Other current complications following acute myocardial infarction: Secondary | ICD-10-CM

## 2011-08-15 DIAGNOSIS — R634 Abnormal weight loss: Secondary | ICD-10-CM

## 2011-08-15 DIAGNOSIS — Z853 Personal history of malignant neoplasm of breast: Secondary | ICD-10-CM

## 2011-08-15 DIAGNOSIS — I509 Heart failure, unspecified: Secondary | ICD-10-CM

## 2011-08-15 DIAGNOSIS — I5032 Chronic diastolic (congestive) heart failure: Secondary | ICD-10-CM

## 2011-08-15 DIAGNOSIS — I82409 Acute embolism and thrombosis of unspecified deep veins of unspecified lower extremity: Secondary | ICD-10-CM

## 2011-08-15 NOTE — Patient Instructions (Addendum)
Your physician recommends that you go to the [facility] for lab work for CEA. If the results of your test are normal or stable, you will receive a letter.  If they are abnormal, the nurse will contact you by phone. Your physician wants you to follow up in: 6 months.  You will receive a reminder letter in the mail one-two months in advance.  If you don't receive a letter, please call our office to schedule the follow up appointment

## 2011-08-17 ENCOUNTER — Encounter: Payer: Self-pay | Admitting: Cardiology

## 2011-08-17 DIAGNOSIS — I82409 Acute embolism and thrombosis of unspecified deep veins of unspecified lower extremity: Secondary | ICD-10-CM | POA: Insufficient documentation

## 2011-08-17 DIAGNOSIS — R634 Abnormal weight loss: Secondary | ICD-10-CM | POA: Insufficient documentation

## 2011-08-17 NOTE — Assessment & Plan Note (Signed)
Chest soft and not on Coumadin.  Followup echocardiogram was negative for LV thrombus

## 2011-08-17 NOTE — Assessment & Plan Note (Signed)
No symptoms of heart failure or volume overload.  Blood pressure is elevated but reportedly is more normal at home.  Given the fact that the patient has no heart failure symptoms we'll hold off on adding antihypertensive medications for now

## 2011-08-17 NOTE — Progress Notes (Signed)
Kristen Bottoms, MD, [facility] ABIM Board Certified in Adult Cardiovascular Medicine,Internal Medicine and Critical Care Medicine    CC: Followup patient with a history of coronary artery disease treated medically.  HPI:  The patient is an 78 year old female who continues to have unusual weight loss.  She has lost about 30 pounds since her last visit.  She has a history of diastolic heart failure.  However she reports no symptoms of dyspnea or chest pain.  She has no volume overload.  Her exercise tolerance is adequate.  Blood pressure slightly elevated in the office today.  She denies any palpitations or presyncope or syncope.  She is otherwise stable from a cardiovascular perspective.  She has a history of LV thrombus but this has resolved and has not been on Coumadin anymore.      PMH: reviewed and listed in Problem List in Electronic Records (and see below) Past Medical History  Diagnosis Date  . Precordial pain   . Old myocardial infarction   . Hypopotassemia   . Esophageal reflux   . Allergic rhinitis, cause unspecified   . Pure hypercholesterolemia   . Unspecified essential hypertension   . Family history of ischemic heart disease   . CAD (coronary artery disease) 07/2000    Status Post NSTEMI, 60% mid RCA lesion treated medically  . Breast cancer 2006  . DVT (deep venous thrombosis) 2006  . Macular degeneration    Past Surgical History  Procedure Date  . Lumbar laminectomy   . Cataract extraction, bilateral   . Abdominal hysterectomy   . Corneal implant     left    Allergies/SH/FHX : available in Electronic Records for review  No Known Allergies History   Social History  . Marital Status: Widowed    Spouse Name: N/A    Number of Children: N/A  . Years of Education: N/A   Occupational History  . RETIRED    Social History Main Topics  . Smoking status: Never Smoker   . Smokeless tobacco: Never Used  . Alcohol Use: No  . Drug Use: No  . Sexually Active: Not  on file   Other Topics Concern  . Not on file   Social History Narrative  . No narrative on file   No family history on file.  Medications: Current Outpatient Prescriptions  Medication Sig Dispense Refill  . LORazepam (ATIVAN) 0.5 MG tablet Take 1 tablet (0.5 mg total) by mouth at bedtime as needed.      . Multiple Vitamin (MULTIVITAMIN PO) Take 1 tablet by mouth daily.        . Multiple Vitamins-Minerals (PRESERVISION/LUTEIN PO) Take 1 tablet by mouth 2 (two) times daily.       . naproxen sodium (ALEVE) 220 MG tablet Take 1 tablet (220 mg total) by mouth as needed.        ROS: No nausea or vomiting. No fever or chills.No melena or hematochezia.No bleeding.No claudication  Physical Exam: BP 163/79  Pulse 86  Ht 5\' 2"  (1.575 m)  Wt 91 lb (41.277 kg)  BMI 16.64 kg/m2 General:Cachectic-appearing female but in no distress. Neck:Normal carotid upstroke and no carotid bruits.  No thyromegaly no nodular thyroid.  JVP is 5-6 cm Lungs:Clear breath sounds bilaterally without wheezing Cardiac:Regular rate and rhythm with normal S1 and S2 and no murmur rubs or gallops Vascular:No edema.  Normal distal pulses. Skin:Warm and dry. Physcologic:Normal affect  12lead ZOX:WRUEAV sinus rhythm heart rate 81 bpm with no acute ischemic changes Limited  bedside ECHO:N/A No images are attached to the encounter.   Assessment and Plan  CAD The patient is doing well.  She reports no symptoms of recurrent chest pain.  She has no shortness of breath orthopnea.  She can continue on current medical regimen.  Weight loss, abnormal The patient continues to have abnormal weight loss.  She has lost another 30 pounds since last visit.  I asked her to discuss this further with her primary care physician.  But given her history of breast cancer I ordered a CEA level.  MURAL THROMBUS Chest soft and not on Coumadin.  Followup echocardiogram was negative for LV thrombus  Chronic diastolic heart failure No  symptoms of heart failure or volume overload.  Blood pressure is elevated but reportedly is more normal at home.  Given the fact that the patient has no heart failure symptoms we'll hold off on adding antihypertensive medications for now    Patient Active Problem List  Diagnoses  . ANEMIA  . CAD  . MURAL THROMBUS  . Chronic diastolic heart failure  . INSOMNIA  . Deep venous thrombosis  . Weight loss, abnormal

## 2011-08-17 NOTE — Assessment & Plan Note (Signed)
The patient is doing well.  She reports no symptoms of recurrent chest pain.  She has no shortness of breath orthopnea.  She can continue on current medical regimen.

## 2011-08-17 NOTE — Assessment & Plan Note (Signed)
The patient continues to have abnormal weight loss.  She has lost another 30 pounds since last visit.  I asked her to discuss this further with her primary care physician.  But given her history of breast cancer I ordered a CEA level.

## 2011-08-19 ENCOUNTER — Telehealth: Payer: Self-pay | Admitting: *Deleted

## 2011-08-19 NOTE — Telephone Encounter (Signed)
Message copied by Murriel Hopper on Mon Aug 19, 2011  4:08 PM ------      Message from: Learta Codding      Created: Sun Aug 18, 2011 12:17 PM       Please refer patient to oncology CEA level significantly elevated and patient has dramatic weight loss and history of breast cancer with no recent f/u.

## 2011-08-19 NOTE — Telephone Encounter (Signed)
Notes Recorded by Lesle Chris, LPN on 04/20/1094 at 4:08 PM Left message to return call.

## 2011-08-20 NOTE — Telephone Encounter (Signed)
Notes Recorded by Lesle Chris, LPN on 04/20/1094 at 11:59 AM Patient notified and verbalized understanding. States she already has appointment scheduled for September with  Dr. Cleone Slim. Will forward this info to him.  Call placed to [facility] at the [facility] (Dr. Cleone Slim)  - informed her of info above.  She stated that info will be given to MD & he will decide if she needs to be seen sooner than September.

## 2011-09-06 NOTE — Telephone Encounter (Signed)
Received call from daughter today leaving message on nurse's voicemail that she needed clarification on appointment with [facility]. Nurse called and informed daughter that results have been sent to [facility] office and according to previous message, she would be contacted by them if she needed an earlier appointment. Daughter verbalized understanding of plan.

## 2011-10-08 ENCOUNTER — Inpatient Hospital Stay (HOSPITAL_COMMUNITY)
Admission: EM | Admit: 2011-10-08 | Discharge: 2011-10-12 | DRG: 249 | Disposition: A | Discharge status: Home / Self Care | Payer: Medicare Other | Source: Other Acute Inpatient Hospital | Attending: Cardiology | Admitting: Cardiology

## 2011-10-08 ENCOUNTER — Encounter (HOSPITAL_COMMUNITY): Payer: Self-pay | Admitting: Physician Assistant

## 2011-10-08 ENCOUNTER — Encounter (HOSPITAL_COMMUNITY): Admission: EM | Disposition: A | Discharge status: Home / Self Care | Payer: Self-pay | Attending: Cardiology

## 2011-10-08 ENCOUNTER — Inpatient Hospital Stay (HOSPITAL_COMMUNITY)
Admission: EM | Admit: 2011-10-08 | Payer: Medicare Other | Source: Other Acute Inpatient Hospital | Admitting: Cardiology

## 2011-10-08 DIAGNOSIS — C50919 Malignant neoplasm of unspecified site of unspecified female breast: Secondary | ICD-10-CM | POA: Diagnosis present

## 2011-10-08 DIAGNOSIS — I2510 Atherosclerotic heart disease of native coronary artery without angina pectoris: Secondary | ICD-10-CM

## 2011-10-08 DIAGNOSIS — G4700 Insomnia, unspecified: Secondary | ICD-10-CM

## 2011-10-08 DIAGNOSIS — Z86718 Personal history of other venous thrombosis and embolism: Secondary | ICD-10-CM

## 2011-10-08 DIAGNOSIS — K219 Gastro-esophageal reflux disease without esophagitis: Secondary | ICD-10-CM | POA: Diagnosis present

## 2011-10-08 DIAGNOSIS — Z79899 Other long term (current) drug therapy: Secondary | ICD-10-CM

## 2011-10-08 DIAGNOSIS — I10 Essential (primary) hypertension: Secondary | ICD-10-CM | POA: Diagnosis present

## 2011-10-08 DIAGNOSIS — R9430 Abnormal result of cardiovascular function study, unspecified: Secondary | ICD-10-CM

## 2011-10-08 DIAGNOSIS — I5032 Chronic diastolic (congestive) heart failure: Secondary | ICD-10-CM

## 2011-10-08 DIAGNOSIS — Z947 Corneal transplant status: Secondary | ICD-10-CM

## 2011-10-08 DIAGNOSIS — Z681 Body mass index (BMI) 19 or less, adult: Secondary | ICD-10-CM

## 2011-10-08 DIAGNOSIS — I82409 Acute embolism and thrombosis of unspecified deep veins of unspecified lower extremity: Secondary | ICD-10-CM

## 2011-10-08 DIAGNOSIS — R51 Headache: Secondary | ICD-10-CM

## 2011-10-08 DIAGNOSIS — E46 Unspecified protein-calorie malnutrition: Secondary | ICD-10-CM | POA: Diagnosis present

## 2011-10-08 DIAGNOSIS — I252 Old myocardial infarction: Secondary | ICD-10-CM

## 2011-10-08 DIAGNOSIS — Z955 Presence of coronary angioplasty implant and graft: Secondary | ICD-10-CM

## 2011-10-08 DIAGNOSIS — I214 Non-ST elevation (NSTEMI) myocardial infarction: Secondary | ICD-10-CM

## 2011-10-08 DIAGNOSIS — I2119 ST elevation (STEMI) myocardial infarction involving other coronary artery of inferior wall: Secondary | ICD-10-CM

## 2011-10-08 DIAGNOSIS — I238 Other current complications following acute myocardial infarction: Secondary | ICD-10-CM

## 2011-10-08 DIAGNOSIS — I4949 Other premature depolarization: Secondary | ICD-10-CM | POA: Diagnosis not present

## 2011-10-08 DIAGNOSIS — R519 Headache, unspecified: Secondary | ICD-10-CM | POA: Clinically undetermined

## 2011-10-08 DIAGNOSIS — IMO0002 Reserved for concepts with insufficient information to code with codable children: Secondary | ICD-10-CM | POA: Clinically undetermined

## 2011-10-08 DIAGNOSIS — Z8249 Family history of ischemic heart disease and other diseases of the circulatory system: Secondary | ICD-10-CM

## 2011-10-08 DIAGNOSIS — R634 Abnormal weight loss: Secondary | ICD-10-CM | POA: Diagnosis present

## 2011-10-08 DIAGNOSIS — I509 Heart failure, unspecified: Secondary | ICD-10-CM | POA: Diagnosis present

## 2011-10-08 DIAGNOSIS — I2129 ST elevation (STEMI) myocardial infarction involving other sites: Principal | ICD-10-CM | POA: Diagnosis present

## 2011-10-08 DIAGNOSIS — I493 Ventricular premature depolarization: Secondary | ICD-10-CM

## 2011-10-08 DIAGNOSIS — D649 Anemia, unspecified: Secondary | ICD-10-CM | POA: Diagnosis present

## 2011-10-08 DIAGNOSIS — E7800 Pure hypercholesterolemia, unspecified: Secondary | ICD-10-CM | POA: Diagnosis present

## 2011-10-08 HISTORY — PX: LEFT HEART CATHETERIZATION WITH CORONARY ANGIOGRAM: SHX5451

## 2011-10-08 HISTORY — DX: Personal history of other diseases of the digestive system: Z87.19

## 2011-10-08 HISTORY — DX: Heart failure, unspecified: I50.9

## 2011-10-08 HISTORY — PX: CARDIAC CATHETERIZATION: SHX172

## 2011-10-08 LAB — POCT I-STAT, CHEM 8
BUN: 17 mg/dL (ref 6–23)
Calcium, Ion: 1.16 mmol/L (ref 1.12–1.32)
Chloride: 109 mEq/L (ref 96–112)
Creatinine, Ser: 1 mg/dL (ref 0.50–1.10)
Glucose, Bld: 139 mg/dL — ABNORMAL HIGH (ref 70–99)
TCO2: 21 mmol/L (ref 0–100)

## 2011-10-08 LAB — POCT ACTIVATED CLOTTING TIME: Activated Clotting Time: 209 seconds

## 2011-10-08 LAB — CARDIAC PANEL(CRET KIN+CKTOT+MB+TROPI)
CK, MB: 126.4 ng/mL (ref 0.3–4.0)
Relative Index: 11 — ABNORMAL HIGH (ref 0.0–2.5)
Total CK: 1152 U/L — ABNORMAL HIGH (ref 7–177)

## 2011-10-08 LAB — TYPE AND SCREEN: ABO/RH(D): A POS

## 2011-10-08 SURGERY — LEFT HEART CATHETERIZATION WITH CORONARY ANGIOGRAM
Anesthesia: LOCAL

## 2011-10-08 MED ORDER — METOPROLOL TARTRATE 12.5 MG HALF TABLET
12.5000 mg | ORAL_TABLET | Freq: Two times a day (BID) | ORAL | Status: DC
  Administered 2011-10-08 – 2011-10-11 (×7): 12.5 mg via ORAL
  Filled 2011-10-08 (×9): qty 1

## 2011-10-08 MED ORDER — ASPIRIN 81 MG PO CHEW
81.0000 mg | CHEWABLE_TABLET | Freq: Every day | ORAL | Status: DC
  Administered 2011-10-08 – 2011-10-12 (×5): 81 mg via ORAL
  Filled 2011-10-08 (×2): qty 4
  Filled 2011-10-08 (×4): qty 1

## 2011-10-08 MED ORDER — CLOPIDOGREL BISULFATE 300 MG PO TABS
ORAL_TABLET | ORAL | Status: AC
  Filled 2011-10-08: qty 2

## 2011-10-08 MED ORDER — LORAZEPAM 0.5 MG PO TABS
0.5000 mg | ORAL_TABLET | Freq: Every evening | ORAL | Status: DC | PRN
  Administered 2011-10-09: 0.5 mg via ORAL
  Filled 2011-10-08: qty 1

## 2011-10-08 MED ORDER — ONDANSETRON HCL 4 MG/2ML IJ SOLN
INTRAMUSCULAR | Status: AC
  Administered 2011-10-08: 4 mg via INTRAVENOUS
  Filled 2011-10-08: qty 2

## 2011-10-08 MED ORDER — NITROGLYCERIN IN D5W 200-5 MCG/ML-% IV SOLN
10.0000 ug/min | INTRAVENOUS | Status: DC
  Filled 2011-10-08: qty 250

## 2011-10-08 MED ORDER — HEPARIN (PORCINE) IN NACL 2-0.9 UNIT/ML-% IJ SOLN
INTRAMUSCULAR | Status: AC
  Filled 2011-10-08: qty 2000

## 2011-10-08 MED ORDER — ONDANSETRON HCL 4 MG/2ML IJ SOLN
4.0000 mg | Freq: Four times a day (QID) | INTRAMUSCULAR | Status: DC | PRN
  Administered 2011-10-08 – 2011-10-09 (×4): 4 mg via INTRAVENOUS
  Filled 2011-10-08 (×4): qty 2

## 2011-10-08 MED ORDER — SODIUM CHLORIDE 0.9 % IV SOLN
INTRAVENOUS | Status: AC
  Administered 2011-10-08: 20:00:00 via INTRAVENOUS

## 2011-10-08 MED ORDER — EPTIFIBATIDE 75 MG/100ML IV SOLN
INTRAVENOUS | Status: AC
  Filled 2011-10-08: qty 100

## 2011-10-08 MED ORDER — NITROGLYCERIN 0.2 MG/ML ON CALL CATH LAB
INTRAVENOUS | Status: AC
  Filled 2011-10-08: qty 1

## 2011-10-08 MED ORDER — LIDOCAINE HCL (PF) 1 % IJ SOLN
INTRAMUSCULAR | Status: AC
  Filled 2011-10-08: qty 30

## 2011-10-08 MED ORDER — ACETAMINOPHEN 325 MG PO TABS
650.0000 mg | ORAL_TABLET | ORAL | Status: DC | PRN
  Administered 2011-10-08: 650 mg via ORAL
  Filled 2011-10-08: qty 2

## 2011-10-08 MED ORDER — BIVALIRUDIN 250 MG IV SOLR
INTRAVENOUS | Status: AC
  Filled 2011-10-08: qty 250

## 2011-10-08 MED ORDER — CLOPIDOGREL BISULFATE 75 MG PO TABS
75.0000 mg | ORAL_TABLET | Freq: Every day | ORAL | Status: DC
  Administered 2011-10-09 – 2011-10-12 (×4): 75 mg via ORAL
  Filled 2011-10-08 (×7): qty 1

## 2011-10-08 NOTE — Progress Notes (Signed)
Patient Kristen Rodriguez. [name], 78 year old white female arrived at Cath Lab in early afternoon.  Chaplain lifted silent prayer for patient.  No family has arrived. I will follow-up as needed.

## 2011-10-08 NOTE — Progress Notes (Signed)
10/08/2011 2230  Dr. Mayford Knife called regarding chest pressure after eating and results of EKG. Trop > 20 also given to Dr. Mayford Knife. Aliea Bobe, [place] Fulling

## 2011-10-08 NOTE — Progress Notes (Signed)
Patient is stable post MI.  Groin looks very stable.  No hematoma at this point.  The 58F sheath was replaced by a 44F sheath.  No oozing after.  VS stable.  No chest pain.  Eptifibatide is off.

## 2011-10-08 NOTE — CV Procedure (Signed)
Cardiac Catheterization Procedure Note  Name: Kristen Rodriguez MRN: [id number] DOB: [birth year]  Procedure: Left Heart Cath, Selective Coronary Angiography, LV angiography,  PTCA/Stent of RCA times two  (One PTCA only, One non DES stent)  Indication: 78 year old female with onset of chest pain and a diagnostic ECG.     Diagnostic Procedure Details: The right groin was prepped, draped, and anesthetized with 1% lidocaine. Using the modified Seldinger technique, a 6 French sheath was introduced into the right femoral artery. Standard Judkins catheters were used for selective coronary angiography. Catheter exchanges were performed over a wire.  The diagnostic procedure was well-tolerated without immediate complications.  LV angio was delayed until following reperfusion.  PCI Procedure Note:  Following the diagnostic procedure, the decision was made to proceed with PCI.  Weight-based bivalirudin was given for anticoagulation.  Oral clopidogrel was also administered.   Once a therapeutic ACT was achieved, a 6 [place] JR4 with [facility] guide catheter was inserted.  The RCA was extremely calcified and severely diseased.  The continuation branch was occluded.    A traverse coronary guidewire was used to cross the lesion.  The lesion was predilated with a 2.25 mm balloon. The balloon was then upgraded to both a 2.5 and 2.75 mm balloon with prolonged inflations.  We elected not to stent the lesion, but felt the proximal lesion needed stenting.  As such, this was then predilated with a 2.75 mm balloon, then stent with a 15mm by 3.5 Vision non DES stent.  Post dil was done to 3.75 with good results.   We then considered stenting the distal lesion, but a stent, even a with a buddy wire, would not traverse through the focal calcification noted in the distal portion of the mid vessel.  We considered using a Guideliner, but the lesion looked pretty good, so we redilated at intermediate pressures for a prolonged period, and used  glycoprotein inhibition to stabilize the lesion.   Following PCI, there was 20% residual stenosis at the distal balloon site, and no stenosis at the proximal stent site.   Final angiography confirmed a satisfactory result. After getting to the holding area, she strained to pass urine, and developed a hematoma around the six french sheath.  Immediate pressure was applied.  A foley was placed.  The six french sheath appeared crimped, and was upgraded to a more secure 71F sheath, and this appeared stable.  Her BP was elevated also and was treated with IV NTG.    PROCEDURAL FINDINGS Hemodynamics: AO 158/66 (102) LV 160/17 No gradient obvious.    Coronary angiography: Coronary dominance: right  Left mainstem: Calcified with 20% distal narrowing.    Left anterior descending (LAD): Heavily calcified with 40% before the diagonal takeoff and 50% segmental after.  The distal vessel is large and wraps the apex.  The diagonal is large with 50% proximal  segmental plaque.  Left circumflex (LCx): small with 30-50% segmental plaque proximally  Right coronary artery (RCA): Large caliber vessel with heavy calcification.  There is an 80% proximal focal lesion that was in a large territory, stented with 0% residual post.  The mid vessel was heavily calcified with diffuse 50-60% calcified narrowing.  It did not appear very favorable for intervention.  The small acute marginal has a 90% stenosis. The first PDA branch is small without narrowing.  The second PDA is mid sized and has 80% proximal and 90% mid narrowing.  The continuation branch is totally occluded and is the infarct  site.  Following PTCA, there is 20% residual with TIMI 3 flow. The PLA is very large.    Left ventriculography: Left ventricular systolic function is slightly reduced, LVEF is estimated at 50%, there is mild inferior hypokinesis,  there is no significant mitral regurgitation   PCI Data: Vessel - RCA/Segment - 3,2 Percent Stenosis (pre)   100, 80 TIMI-flow 0, 2 Stent none for 3, 3.5 by 15 Vision for prox Percent Stenosis (post) 20, 0% TIMI-flow (post) 3,3  Final Conclusions:   1.  Inferior MI with occlusion of the PLA with reperfusion with POBA, stent placed proximally for high grade disease 2.  Preserved overall LV  Recommendations:  1.  DAPT 2.  Low dose beta blockers. 3.  Monitor groin.    [name] 10/08/2011, 3:45 PM

## 2011-10-08 NOTE — H&P (Signed)
History and Physical   Patient ID: Kristen Rodriguez MRN: [id number], DOB/AGE: Aug 14, 1923   Admit date: 10/08/2011 Date of Consult: 10/08/2011   Primary Physician: Kristen Jester, DO Primary Cardiologist: Kristen Rodriguez, Kristen Piper, MD  Pt. Profile: Kristen Rodriguez is a 78yo Caucasian female with PMHx significant for CAD (NSTEMI 2002 s/p cardiac cath revealing tubular 70% stenosis before the PDA not believed to represent the culprit lesion, medically managed), chronic diastolic CHF (per 2012 echo, without clinical manifestation), chronic anemia, GERD, history of DVT (on tamoxifen) and breast cancer who was transported emergently to [facility] for posterior STEMI.   Problem List: Past Medical History  Diagnosis Date  . Precordial pain   . Old myocardial infarction   . Hypopotassemia   . Esophageal reflux   . Allergic rhinitis, cause unspecified   . Pure hypercholesterolemia   . Unspecified essential hypertension   . Family history of ischemic heart disease   . CAD (coronary artery disease) 07/2000    Status Post NSTEMI, 60% mid RCA lesion treated medically  . Breast cancer 2006  . DVT (deep venous thrombosis) 2006  . Macular degeneration     Past Surgical History  Procedure Date  . Lumbar laminectomy   . Cataract extraction, bilateral   . Abdominal hysterectomy   . Corneal implant     left  . Cardiac catheterization 2002    Without PCI     Allergies:  Allergies  Allergen Reactions  . Codeine   . Penicillins   . Sulfa Antibiotics    * Patient only notes codeine. On prior record review, PCN and sulfa abx listed as well.  HPI:   The patient last saw Dr. Earnestine Rodriguez in 08/15/11 for follow-up. She had endorsed unintentional weight loss (~30 lbs) since 10/12. She had originally been seen in follow-up for questionable LV thrombus which had resolved on Coumadin. She has since been taken off of anticoagulation. She was found to be euvolemic at that time without complaints of shortness of breath, dyspnea,  orthopnea, palpitations, presyncope or syncope. A CEA level was ordered given her history of breast cancer.   The history was provided by the patient and supplemented by EMS. The patient began experiencing chest pain this morning around 9:00 AM. She presented to [facility] ED. EKG done there revealed down sloping ST depressions V2-V4 consistent with posterior STEMI. She was started on heparin (3500 unit bolus). She was initially given NTG paste, then NTG gtt was started and up-titrated to 30mg . She received a total of MSO4 8mg . Chest pain alleviation to 1/10. Additionally, she received a GI cocktail. She was transported emergently to [facility] for cardiac catheterization. VSS stable en route per EMS report.   On evaluation, she is alert, responsive, and comfortable. Reports 1/10 chest pain.   Home Medications: Prior to Admission medications   Medication Sig Start Date End Date Taking? Authorizing Provider  LORazepam (ATIVAN) 0.5 MG tablet Take 1 tablet (0.5 mg total) by mouth at bedtime as needed. 01/22/11   Kristen Leap, MD  Multiple Vitamin (MULTIVITAMIN PO) Take 1 tablet by mouth daily.      Historical Provider, MD  Multiple Vitamins-Minerals (PRESERVISION/LUTEIN PO) Take 1 tablet by mouth 2 (two) times daily.     Historical Provider, MD  naproxen sodium (ALEVE) 220 MG tablet Take 1 tablet (220 mg total) by mouth as needed. 01/22/11 01/22/12  Kristen Leap, MD   Inpatient Medications:     . bivalirudin      .  clopidogrel      . heparin      . lidocaine      . nitroGLYCERIN      . ondansetron       Prescriptions prior to admission  Medication Sig Dispense Refill  . LORazepam (ATIVAN) 0.5 MG tablet Take 1 tablet (0.5 mg total) by mouth at bedtime as needed.      . Multiple Vitamin (MULTIVITAMIN PO) Take 1 tablet by mouth daily.        . Multiple Vitamins-Minerals (PRESERVISION/LUTEIN PO) Take 1 tablet by mouth 2 (two) times daily.       . naproxen sodium (ALEVE) 220 MG tablet Take  1 tablet (220 mg total) by mouth as needed.        Family History  Problem Relation Age of Onset  . Heart attack Father 64    Fatal  . Cancer       History   Social History  . Marital Status: Widowed    Spouse Name: N/A    Number of Children: N/A  . Years of Education: N/A   Occupational History  . RETIRED    Social History Main Topics  . Smoking status: Never Smoker   . Smokeless tobacco: Never Used  . Alcohol Use: No  . Drug Use: No  . Sexually Active: Not on file   Other Topics Concern  . Not on file   Social History Narrative  . No narrative on file     Review of Systems: Cardiovascular: positive for chest pain, shortness of breath  All other systems unable to be assessed due to acuity of the situation.   Physical Exam: There were no vitals taken for this visit.    General: Well developed, well nourished, in no acute distress. Head: Normocephalic, atraumatic, sclera non-icteric, no xanthomas, nares are without discharge.  Neck: Negative for carotid bruits. JVD not elevated. Lungs: Clear bilaterally to auscultation without wheezes, rales, or rhonchi. Breathing is unlabored. Heart: RRR with S1 S2. No murmurs, rubs, or gallops appreciated. Abdomen: Soft, non-tender, non-distended with normoactive bowel sounds. No hepatomegaly. No rebound/guarding. No obvious abdominal masses. Msk:  Strength and tone appears normal for age. Extremities: No clubbing, cyanosis or edema.  Distal pedal pulses are 2+ and equal bilaterally. Neuro: Alert and oriented X 3. Moves all extremities spontaneously. Psych:  Responds to questions appropriately with a normal affect.  Labs:  None  Radiology/Studies: No results found.  EKG: sinus bradycardia, 59 bpm, downsloping ST depressions V2-V4 (</= 2 mm)  ASSESSMENT AND PLAN:   1. Posterior STEMI- evidenced by EKG at [facility]. Patient has a history of CAD noted in the past. 2002 cath did reveal tubular RCA lesion just  proximal to PDA. Today's episode may represent progression of this disease.   - Further recommendations per interventionalist's findings   2. CAD- see above. Patient not on ASA outpatient. No NTG noted. Will need to be on these in addition to further antiplatelet and likely BB therapy (pending BP monitoring). Will need to be started on a statin as well.   - Further recommendations post-cath  - Lipid panel, A1C   3. Chronic diastolic CHF - on 2012 echo. Per Dr. Earnestine Rodriguez, there has been no clinical evidence of heart failure. Today, she is euvolemic on exam. Will continue to monitor volume status post-cath.   - Strict I/Os, daily weights  - Heart healthy diet   4. Chronic anemia  - Will review CBC  - Monitor post-cath  5. GERD  - Will add PPI   Signed, R. Hurman Horn, PA-C 10/08/2011, 1:53 PM   Patient sent by the [facility] ER as Code STEMI.  ECG is diagnostic.  Dr. Margarita Mail notes from [place] reviewed.  Patient started having pain about 9am.  Transferred for urgent cath.  Patient seen and examined.  I agree with the notes as outlined by Mr. Arguello.  Urgent study suggested and the patient is agreeable to proceed.  Emergency consent.

## 2011-10-09 ENCOUNTER — Inpatient Hospital Stay (HOSPITAL_COMMUNITY): Payer: Medicare Other

## 2011-10-09 DIAGNOSIS — I369 Nonrheumatic tricuspid valve disorder, unspecified: Secondary | ICD-10-CM

## 2011-10-09 DIAGNOSIS — I2510 Atherosclerotic heart disease of native coronary artery without angina pectoris: Secondary | ICD-10-CM

## 2011-10-09 DIAGNOSIS — I2129 ST elevation (STEMI) myocardial infarction involving other sites: Secondary | ICD-10-CM | POA: Diagnosis present

## 2011-10-09 LAB — CBC
HCT: 30.4 % — ABNORMAL LOW (ref 36.0–46.0)
Hemoglobin: 9.9 g/dL — ABNORMAL LOW (ref 12.0–15.0)
MCH: 30.6 pg (ref 26.0–34.0)
MCV: 93.8 fL (ref 78.0–100.0)
RBC: 3.24 MIL/uL — ABNORMAL LOW (ref 3.87–5.11)
WBC: 9.7 10*3/uL (ref 4.0–10.5)

## 2011-10-09 LAB — BASIC METABOLIC PANEL
CO2: 27 mEq/L (ref 19–32)
Calcium: 8.9 mg/dL (ref 8.4–10.5)
Chloride: 103 mEq/L (ref 96–112)
Creatinine, Ser: 0.9 mg/dL (ref 0.50–1.10)
Glucose, Bld: 111 mg/dL — ABNORMAL HIGH (ref 70–99)

## 2011-10-09 LAB — CARDIAC PANEL(CRET KIN+CKTOT+MB+TROPI)
CK, MB: 58.1 ng/mL (ref 0.3–4.0)
CK, MB: 24.8 ng/mL (ref 0.3–4.0)
Relative Index: 7.2 — ABNORMAL HIGH (ref 0.0–2.5)
Total CK: 677 U/L — ABNORMAL HIGH (ref 7–177)
Total CK: 375 U/L — ABNORMAL HIGH (ref 7–177)
Troponin I: >20 ng/mL (ref <0.30)

## 2011-10-09 LAB — POCT ACTIVATED CLOTTING TIME: Activated Clotting Time: 170 seconds

## 2011-10-09 MED ORDER — POTASSIUM CHLORIDE CRYS ER 20 MEQ PO TBCR
EXTENDED_RELEASE_TABLET | ORAL | Status: AC
  Filled 2011-10-09: qty 2

## 2011-10-09 MED ORDER — PANTOPRAZOLE SODIUM 40 MG PO TBEC
40.0000 mg | DELAYED_RELEASE_TABLET | Freq: Every day | ORAL | Status: DC
  Administered 2011-10-10 – 2011-10-12 (×3): 40 mg via ORAL
  Filled 2011-10-09 (×3): qty 1

## 2011-10-09 MED ORDER — MORPHINE SULFATE 2 MG/ML IJ SOLN
INTRAMUSCULAR | Status: AC
  Administered 2011-10-09: 2 mg via INTRAVENOUS
  Filled 2011-10-09: qty 1

## 2011-10-09 MED ORDER — FAMOTIDINE 40 MG PO TABS
40.0000 mg | ORAL_TABLET | Freq: Once | ORAL | Status: AC
  Administered 2011-10-09: 40 mg via ORAL
  Filled 2011-10-09: qty 1

## 2011-10-09 MED ORDER — FAMOTIDINE 40 MG PO TABS
40.0000 mg | ORAL_TABLET | Freq: Every day | ORAL | Status: DC
  Administered 2011-10-09: 40 mg via ORAL
  Filled 2011-10-09: qty 1

## 2011-10-09 MED ORDER — ADULT MULTIVITAMIN W/MINERALS CH
1.0000 | ORAL_TABLET | Freq: Every day | ORAL | Status: DC
  Administered 2011-10-09 – 2011-10-12 (×4): 1 via ORAL
  Filled 2011-10-09 (×4): qty 1

## 2011-10-09 MED ORDER — MORPHINE SULFATE 2 MG/ML IJ SOLN
2.0000 mg | INTRAMUSCULAR | Status: DC | PRN
  Administered 2011-10-09 (×3): 2 mg via INTRAVENOUS
  Filled 2011-10-09 (×2): qty 1

## 2011-10-09 MED ORDER — DIPHENHYDRAMINE HCL 25 MG PO CAPS
25.0000 mg | ORAL_CAPSULE | Freq: Every evening | ORAL | Status: DC | PRN
  Administered 2011-10-09 – 2011-10-11 (×3): 25 mg via ORAL
  Filled 2011-10-09 (×3): qty 1

## 2011-10-09 MED FILL — Nitroglycerin IV Soln 200 MCG/ML in D5W: INTRAVENOUS | Qty: 250 | Status: AC

## 2011-10-09 MED FILL — Morphine Sulfate Inj 10 MG/ML: INTRAMUSCULAR | Qty: 1 | Status: AC

## 2011-10-09 MED FILL — Dextrose Inj 5%: INTRAVENOUS | Qty: 50 | Status: AC

## 2011-10-09 NOTE — Progress Notes (Signed)
HPI:  Patient looks ok, but complaining largely of nausea, and headache.  Alert with no neuro symptoms otherwise.  BP went up yesterday associated with need to urinate.  Had some pressure in the middle of the night, and still.  She thinks the headache is from sinus issues.  Her chest still bothers her---I would be reluctant to return her to the cath lab unless this persists.    Current Facility-Administered Medications  Medication Dose Route Frequency Provider Last Rate Last Dose  . 0.9 %  sodium chloride infusion   Intravenous Continuous Herby Abraham, MD 10 mL/hr at 10/08/11 2002    . acetaminophen (TYLENOL) tablet 650 mg  650 mg Oral Q4H PRN Herby Abraham, MD   650 mg at 10/08/11 2232  . aspirin chewable tablet 81 mg  81 mg Oral Daily Herby Abraham, MD   81 mg at 10/08/11 1943  . bivalirudin (ANGIOMAX) 250 MG injection           . clopidogrel (PLAVIX) 300 MG tablet           . clopidogrel (PLAVIX) tablet 75 mg  75 mg Oral Q breakfast Herby Abraham, MD      . eptifibatide (INTEGRILIN) 75 mg / 100 mL infusion           . heparin 2-0.9 UNIT/ML-% infusion           . lidocaine (XYLOCAINE) 1 % injection           . LORazepam (ATIVAN) tablet 0.5 mg  0.5 mg Oral QHS PRN Herby Abraham, MD   0.5 mg at 10/09/11 0018  . metoprolol tartrate (LOPRESSOR) tablet 12.5 mg  12.5 mg Oral BID Herby Abraham, MD   12.5 mg at 10/08/11 2133  . morphine 2 MG/ML injection 2 mg  2 mg Intravenous Q2H PRN Bernestine Amass. Turner, MD   2 mg at 10/09/11 0815  . nitroGLYCERIN (NTG ON-CALL) 0.2 mg/mL injection           . nitroGLYCERIN 0.2 mg/mL in dextrose 5 % infusion  10 mcg/min Intravenous Continuous Herby Abraham, MD 3 mL/hr at 10/08/11 2002 10 mcg/min at 10/08/11 2002  . ondansetron (ZOFRAN) injection 4 mg  4 mg Intravenous Q6H PRN Herby Abraham, MD   4 mg at 10/09/11 0351    Allergies  Allergen Reactions  . Codeine   . Penicillins   . Sulfa Antibiotics     Past Medical History  Diagnosis  Date  . Precordial pain   . Old myocardial infarction   . Hypopotassemia   . Esophageal reflux   . Allergic rhinitis, cause unspecified   . Pure hypercholesterolemia   . Unspecified essential hypertension   . Family history of ischemic heart disease   . CAD (coronary artery disease) 07/2000    Status Post NSTEMI, 60% mid RCA lesion treated medically  . Breast cancer 2006  . DVT (deep venous thrombosis) 2006  . Macular degeneration   . CHF (congestive heart failure)   . H/O hiatal hernia     Past Surgical History  Procedure Date  . Lumbar laminectomy   . Cataract extraction, bilateral   . Abdominal hysterectomy   . Corneal implant     left  . Cardiac catheterization 2002    Without PCI    Family History  Problem Relation Age of Onset  . Heart attack Father 69    Fatal  . Cancer  History   Social History  . Marital Status: Widowed    Spouse Name: N/A    Number of Children: N/A  . Years of Education: N/A   Occupational History  . RETIRED    Social History Main Topics  . Smoking status: Never Smoker   . Smokeless tobacco: Never Used  . Alcohol Use: No  . Drug Use: No  . Sexually Active: No   Other Topics Concern  . Not on file   Social History Narrative  . No narrative on file    ROS: Please see the HPI.  All other systems reviewed and negative.  PHYSICAL EXAM:  BP 132/52  Pulse 73  Temp 97.9 F (36.6 C) (Oral)  Resp 20  Ht 5' 2.5" (1.588 m)  Wt 88 lb 2.9 oz (40 kg)  BMI 15.87 kg/m2  SpO2 97%  General: Thin chronically ill appearing woman in no distress.   Head:  Normocephalic and atraumatic. Neck: no JVD Lungs: Clear to auscultation and percussion. Heart: Normal S1 and S2.  No murmur, rubs or gallops.  Abdomen:  Normal bowel sounds; soft; non tender; no organomegaly Pulses: Pulses normal in all 4 extremities.  No obvious hematoma and no bruit at cath site.   Extremities: No clubbing or cyanosis. No edema. Neurologic: Alert and  oriented x 3.  EKG:  NSR with PVCs.  Nonspecific ST changes.    ASSESSMENT AND PLAN:  1.  Acute posterior MI   ---  Continue asa, plavix, and beta blockade.  Hold on new meds. 2.  Chest pressure  -- not sure source.  Would not favor return to lab at this point in time.  Recheck enzymes for reelevation.  Add pepcid for now.   3.  Weight loss  -- check thyroid function.   4.  Headache  -- conservative for now.  Avoid narcotic analgesics.

## 2011-10-09 NOTE — Progress Notes (Addendum)
INITIAL ADULT NUTRITION ASSESSMENT Date: 10/09/2011   Time: 9:52 AM Reason for Assessment: Nutrition Risk Report  ASSESSMENT: Female 78 y.o.  Dx: STEMI, emergent cath   Hx:  Past Medical History  Diagnosis Date  . Precordial pain   . Old myocardial infarction   . Hypopotassemia   . Esophageal reflux   . Allergic rhinitis, cause unspecified   . Pure hypercholesterolemia   . Unspecified essential hypertension   . Family history of ischemic heart disease   . CAD (coronary artery disease) 07/2000    Status Post NSTEMI, 60% mid RCA lesion treated medically  . Breast cancer 2006  . DVT (deep venous thrombosis) 2006  . Macular degeneration   . CHF (congestive heart failure)   . H/O hiatal hernia     Related Meds:     . aspirin  81 mg Oral Daily  . bivalirudin      . clopidogrel      . clopidogrel  75 mg Oral Q breakfast  . eptifibatide      . famotidine  40 mg Oral Daily  . heparin      . lidocaine      . metoprolol tartrate  12.5 mg Oral BID  . nitroGLYCERIN      . potassium chloride SA         Ht: 5' 2.5" (158.8 cm)  Wt: 88 lb 2.9 oz (40 kg)  Ideal Wt: 51.1 kg  % Ideal Wt: 78%  Usual Wt:  Wt Readings from Last 10 Encounters:  10/09/11 88 lb 2.9 oz (40 kg)  10/09/11 88 lb 2.9 oz (40 kg)  08/15/11 91 lb (41.277 kg)  01/21/11 94 lb 6.4 oz (42.82 kg)  06/13/10 93 lb 12 oz (42.525 kg)  04/27/10 96 lb 4 oz (43.659 kg)  Pt reports UBW at 120 lbs, unknown time frame   % Usual Wt: 73%  Body mass index is 15.87 kg/(m^2). Pt is underweight   Food/Nutrition Related Hx: Pt reports decreased intake, very little appetite and weight loss.   Labs:  CMP     Component Value Date/Time   NA 138 10/09/2011 0322   K 3.9 10/09/2011 0322   CL 103 10/09/2011 0322   CO2 27 10/09/2011 0322   GLUCOSE 111* 10/09/2011 0322   BUN 13 10/09/2011 0322   CREATININE 0.90 10/09/2011 0322   CALCIUM 8.9 10/09/2011 0322   GFRNONAA 57* 10/09/2011 0322   GFRAA 66* 10/09/2011 0322      Intake/Output Summary (Last 24 hours) at 10/09/11 0954 Last data filed at 10/09/11 0600  Gross per 24 hour  Intake 167.57 ml  Output   1000 ml  Net -832.43 ml     Diet Order: Cardiac  Supplements/Tube Feeding: none  IVF:    sodium chloride Last Rate: 10 mL/hr at 10/08/11 2002  nitroGLYCERIN Last Rate: 10 mcg/min (10/08/11 2002)    Estimated Nutritional Needs:   Kcal: 1200-1400  Protein: 50-60 gm  Fluid:  > 1.5 L   Pt admitted and underwent cath for STEMI. Pt is underweight and reports recent weight loss. Pt lives at home alone and does not fix much food for herself because she does not have much of an appetite. Has Ensure at home but does not drink it. Refuses supplements at this time. Is willing to have a HS snack. Pt reports being nauseated, refused breakfast and does not currently want anything to eat.   Per weight hx pt has had  3.2% weight loss in  under 2 months, not significant. Pt had visible mild muscle loss evident at the clavicles. Pt was not able to give a clear intake recall, only that she "is lucky to eat 2 meals a day." usually eats less then 2 meals a day, drinks water or milk, does not drink her Ensure. RD interprets this as <75% for 3 months.   Pt meets criteria for moderate malnutrition in the context of social or environmental circumstances 2/2 intake and wasting as described above.   NUTRITION DIAGNOSIS: -Inadequate oral intake (NI-2.1).  Status: Ongoing  RELATED TO: lack of appetite  AS EVIDENCE BY: weight loss, muscle wasting, moderate malnutrition   MONITORING/EVALUATION(Goals): Goal: PO intake to meet >/=90% of estimated nutrition needs Monitor: PO intake, weight, labs, I/O's  EDUCATION NEEDS: -No education needs identified at this time  INTERVENTION: 1. RD will add HS snack 2. Add multivitamin  3. ? Need for appetite stimulant 4. RD will continue to follow    DOCUMENTATION CODES Per approved criteria  -Non-severe (moderate)  malnutrition in the context of social or environmental circumstances -Underweight    Clarene Duke RD, LDN Pager [phone]

## 2011-10-09 NOTE — Progress Notes (Signed)
Reviewed chart, will hold ambulation today given CP, HA and nausea. Ethelda Chick CES, ACSM

## 2011-10-09 NOTE — Progress Notes (Signed)
Patient is stable.  Her headache is better, and chest pain is clearly less.  CKMB trend is lower.   She remains nauseated with some dry heaves.  Her abdomen is soft and groin appears stable.  Will continue to monitor.  I will recheck Hgb in the am, and add PPI to regimen, give additional pepcid tonight.  Hemodynamics remain stable.    I will be out of town but [place] [facility] service is following.

## 2011-10-09 NOTE — Progress Notes (Signed)
10/09/2011  0220  C/o "terrible headache"  No relief from Tylenol.  Dr. Mayford Knife called. Morphine 2 mg IV given. Dr. Mayford Knife also reviewed EKG done earlier. Carrye Goller, Linnell Fulling

## 2011-10-09 NOTE — Care Management Note (Signed)
    Page 1 of 1   10/09/2011     10:20:46 AM   CARE MANAGEMENT NOTE 10/09/2011  Patient:  Kristen Rodriguez, Kristen Rodriguez   Account Number:  [id number]  Date Initiated:  10/09/2011  Documentation initiated by:  Junius Creamer  Subjective/Objective Assessment:   adm w mi     Action/Plan:   lives alone, pcp dr Aram Beecham butler   Anticipated DC Date:     Anticipated DC Plan:        DC Planning Services  CM consult      Choice offered to / List presented to:             Status of service:   Medicare Important Message given?   (If response is "NO", the following Medicare IM given date fields will be blank) Date Medicare IM given:   Date Additional Medicare IM given:    Discharge Disposition:    Per UR Regulation:  Reviewed for med. necessity/level of care/duration of stay  If discussed at Long Length of Stay Meetings, dates discussed:    Comments:  6/26 10:19a debbie Darothy Courtright rn,bsn [phone]

## 2011-10-09 NOTE — Progress Notes (Signed)
  Echocardiogram 2D Echocardiogram has been performed.  Georgian Co 10/09/2011, 4:28 PM

## 2011-10-09 NOTE — Clinical Documentation Improvement (Signed)
MALNUTRITION DOCUMENTATION CLARIFICATION  THIS DOCUMENT IS NOT A PERMANENT PART OF THE MEDICAL RECORD  TO RESPOND TO THE THIS QUERY, FOLLOW THE INSTRUCTIONS BELOW:  1. If needed, update documentation for the patient's encounter via the notes activity.  2. Access this query again and click edit on the In [employer].  3. After updating, or not, click F2 to complete all highlighted (required) fields concerning your review. Select "additional documentation in the medical record" OR "no additional documentation provided".  4. Click Sign note button.  5. The deficiency will fall out of your In Basket *Please let us know if you are not able to complete this workflow by phone or e-mail (listed below).  Please update your documentation within the medical record to reflect your response to this query.                                                                                        10/09/11   Dear Dr. [facility] Kill [facility],  In a better effort to capture your patient's severity of illness, reflect appropriate length of stay and utilization of resources, a review of the patient medical record has revealed the following indicators. Based on your clinical judgment, please clarify and document in a progress note and/or discharge summary the clinical condition associated with the following supporting information:In responding to this query please exercise your independent judgment.  The fact that a query is asked, does not imply that any particular answer is desired or expected.  Possible Clinical Conditions?  Moderate Malnutrition Severe Malnutrition   Protein Calorie Malnutrition Severe Protein Calorie Malnutrition Cachexia   Other Condition Cannot clinically determine   Supporting Information: Risk Factors: per report: nausea;  Pt was not able to give a clear intake recall, only that she "is lucky to eat 2 meals a day." usually eats less then 2 meals a day, drinks water or milk, does not  drink her Ensure.  Pt lives at home alone and does not fix much food for herself because she does not have much of an appetite  Signs & Symptoms: Pt had visible mild muscle loss evident at the clavicles. Ht 5'2    Wt: 88  BMI: 15.87  Weight  Loss: RD interprets this as <75% for 3 months  Diagnostics:  Calcium level: 8.9  Treatment: Nutrition Consult: Pt is underweight and reports recent weight loss. Has Ensure at home but does not drink it. Refuses supplements at this time. Is willing to have a HS snack. Pt refused breakfast and does not currently want anything to eat. Per weight hx pt has had 3.2% weight loss in under 2 months, not significant.  RD interprets this as <75% for 3 months.  -Non-severe (moderate) malnutrition in the context of social or environmental circumstances           -Underweight  You may use possible, probable, or suspect with inpatient documentation. possible, probable, suspected diagnoses MUST be documented at the time of discharge  Reviewed: additional documentation in the medical record djh  Thank You,  Carlena Sax, BSN, CCM    Clinical Documentation Specialist: [email or web address] [phone]   Health Information  Management [place]

## 2011-10-10 DIAGNOSIS — I493 Ventricular premature depolarization: Secondary | ICD-10-CM | POA: Clinically undetermined

## 2011-10-10 DIAGNOSIS — R519 Headache, unspecified: Secondary | ICD-10-CM | POA: Clinically undetermined

## 2011-10-10 DIAGNOSIS — R51 Headache: Secondary | ICD-10-CM | POA: Clinically undetermined

## 2011-10-10 DIAGNOSIS — D649 Anemia, unspecified: Secondary | ICD-10-CM

## 2011-10-10 DIAGNOSIS — R9430 Abnormal result of cardiovascular function study, unspecified: Secondary | ICD-10-CM | POA: Clinically undetermined

## 2011-10-10 LAB — CBC
HCT: 29.2 % — ABNORMAL LOW (ref 36.0–46.0)
MCH: 30.9 pg (ref 26.0–34.0)
MCHC: 33.2 g/dL (ref 30.0–36.0)
MCV: 93 fL (ref 78.0–100.0)
Platelets: 219 10*3/uL (ref 150–400)
RDW: 12.3 % (ref 11.5–15.5)

## 2011-10-10 LAB — BASIC METABOLIC PANEL
BUN: 15 mg/dL (ref 6–23)
Calcium: 9.4 mg/dL (ref 8.4–10.5)
Creatinine, Ser: 1.08 mg/dL (ref 0.50–1.10)
GFR calc Af Amer: 53 mL/min — ABNORMAL LOW (ref >90)
GFR calc non Af Amer: 45 mL/min — ABNORMAL LOW (ref >90)

## 2011-10-10 NOTE — Progress Notes (Signed)
Patient ID: Kristen Rodriguez, female   DOB: 04/24/1923, 78 y.o.   MRN: [id number]   SUBJECTIVE:  Patient is feeling better today. Her nausea is improved. Her headache is gone. She presented yesterday with an acute MI. There was occlusion of the PLA. This was opened with POBA, and a stent was placed proximal to this with high-grade disease.   Filed Vitals:   10/10/11 0200 10/10/11 0400 10/10/11 0500 10/10/11 0600  BP: 133/52 139/54    Pulse: 36 78  35  Temp:  99 F (37.2 C)    TempSrc:      Resp: 21 19  21   Height:      Weight:   91 lb 14.9 oz (41.7 kg)   SpO2: 98% 95%  97%    Intake/Output Summary (Last 24 hours) at 10/10/11 0817 Last data filed at 10/10/11 0200  Gross per 24 hour  Intake    250 ml  Output   1250 ml  Net  -1000 ml    LABS: Basic Metabolic Panel:  Basename 10/09/11 0322 10/08/11 1318  NA 138 142  K 3.9 3.8  CL 103 109  CO2 27 --  GLUCOSE 111* 139*  BUN 13 17  CREATININE 0.90 1.00  CALCIUM 8.9 --  MG -- --  PHOS -- --   Liver Function Tests: No results found for this basename: AST:2,ALT:2,ALKPHOS:2,BILITOT:2,PROT:2,ALBUMIN:2 in the last 72 hours No results found for this basename: LIPASE:2,AMYLASE:2 in the last 72 hours CBC:  Basename 10/09/11 0322 10/08/11 1318  WBC 9.7 --  NEUTROABS -- --  HGB 9.9* 10.9*  HCT 30.4* 32.0*  MCV 93.8 --  PLT 218 --   Cardiac Enzymes:  Basename 10/09/11 1534 10/09/11 1054 10/09/11 0322  CKTOTAL 375* 453* 677*  CKMB 24.8* 32.6* 58.1*  CKMBINDEX -- -- --  TROPONINI >20.00* >20.00* >20.00*   BNP: No components found with this basename: POCBNP:3 D-Dimer: No results found for this basename: DDIMER:2 in the last 72 hours Hemoglobin A1C: No results found for this basename: HGBA1C in the last 72 hours Fasting Lipid Panel: No results found for this basename: CHOL,HDL,LDLCALC,TRIG,CHOLHDL,LDLDIRECT in the last 72 hours Thyroid Function Tests:  Basename 10/09/11 1049  TSH 1.888  T4TOTAL --  T3FREE --  THYROIDAB  --    RADIOLOGY: Dg Chest Port 1 View  10/09/2011  *RADIOLOGY REPORT*  Clinical Data: Shortness of breath.  PORTABLE CHEST - 1 VIEW  Comparison: 10/08/2011.  Findings: Trachea is midline.  Heart size normal.  Hiatal hernia is noted.  Atherosclerotic calcification is seen in the aorta. Minimal left basilar atelectasis.  Lungs are otherwise clear.  No pleural fluid.  IMPRESSION: Minimal basilar atelectasis.  Original Report Authenticated By: Reyes Ivan, M.D.    PHYSICAL EXAM   The patient is frail but stable. There is no jugulovenous distention. Lungs reveal a few scattered rhonchi. There is no respiratory distress. Cardiac exam reveals S1 and S2. There are no clicks or significant murmurs. The abdomen is soft. There is no peripheral edema.   TELEMETRY:  I have personally reviewed telemetry October 10, 2011. There is sinus rhythm. She does have PVCs including ventricular bigeminy.   ASSESSMENT AND PLAN:   *Acute MI, true posterior wall    The patient is now recovering well. There had been some chest discomfort last evening. This is resolved.   ANEMIA    We will follow her CBC.   Chronic diastolic heart failure    There is history of some diastolic heart  failure in the past. This had been clinically stable recently. There is no evidence of CHF at this time.   Weight loss, abnormal    TSH was ordered yesterday. The TSH is normal.   PVCs (premature ventricular contractions)     Patient is having PVCs with some bigeminy. Labs have been ordered to check her potassium again and her magnesium.    Headache   No further headache today.   Ejection fraction < 50%   2-D echo was done yesterday. The ejection fraction now seems to be in the 40-45% range. There is inferior hypokinesis. In the past there had been question of an apical clot in the patient had actually received Coumadin. This had resolved in the past and Coumadin had been stopped. There is no evidence of an apical clot at this  time.  The patient is elderly and frail. She is one day after a posterior MI treated successfully. She can be moved to step down. However she is not ready to be moved to the floor telemetry yet.   Kristen Rodriguez 10/10/2011 8:17 AM

## 2011-10-10 NOTE — Progress Notes (Addendum)
CARDIAC REHAB PHASE I   PRE:  Rate/Rhythm: 73SR PVCs  BP:  Supine: 109/81  Sitting:   Standing:    SaO2: 100%RA  MODE:  Ambulation: 250 ft   POST:  Rate/Rhythem: 93SR pvcs  BP:  Supine:   Sitting: 147/55  Standing:    SaO2: 99%RA 1120-1152 Pt walked 250 ft on RA with rolling walker and asst x 1. Generalizes weakness. Legs weak per pt. To bathroom and then to recliner with call bell. Encouraged pt to call for asst. When needing to get up. Gave MI booklet and stent booklet.  Duanne Limerick

## 2011-10-11 DIAGNOSIS — I2129 ST elevation (STEMI) myocardial infarction involving other sites: Secondary | ICD-10-CM

## 2011-10-11 LAB — BASIC METABOLIC PANEL
BUN: 19 mg/dL (ref 6–23)
Chloride: 104 mEq/L (ref 96–112)
Creatinine, Ser: 1.13 mg/dL — ABNORMAL HIGH (ref 0.50–1.10)
Glucose, Bld: 121 mg/dL — ABNORMAL HIGH (ref 70–99)
Potassium: 3.7 mEq/L (ref 3.5–5.1)

## 2011-10-11 LAB — HEPATIC FUNCTION PANEL
AST: 30 U/L (ref 0–37)
Albumin: 3.1 g/dL — ABNORMAL LOW (ref 3.5–5.2)
Alkaline Phosphatase: 79 U/L (ref 39–117)
Total Bilirubin: 0.2 mg/dL — ABNORMAL LOW (ref 0.3–1.2)

## 2011-10-11 LAB — LIPID PANEL
LDL Cholesterol: 105 mg/dL — ABNORMAL HIGH (ref 0–99)
VLDL: 19 mg/dL (ref 0–40)

## 2011-10-11 MED ORDER — ATORVASTATIN CALCIUM 80 MG PO TABS
80.0000 mg | ORAL_TABLET | Freq: Every day | ORAL | Status: DC
  Administered 2011-10-11: 80 mg via ORAL
  Filled 2011-10-11 (×2): qty 1

## 2011-10-11 NOTE — Progress Notes (Signed)
Patient Name: Kristen Rodriguez Date of Encounter: 10/11/2011  Principal Problem:  *Acute MI, true posterior wall Active Problems:  ANEMIA  CAD  Chronic diastolic heart failure  Weight loss, abnormal  PVCs (premature ventricular contractions)  Anemia  Headache  Ejection fraction < 50%    SUBJECTIVE: Denies any chest pain/discomfort, dizziness,SOB or n/v.   OBJECTIVE Filed Vitals:   10/10/11 2200 10/10/11 2310 10/11/11 0402 10/11/11 0800  BP: 123/61 164/77 169/52 155/53  Pulse: 41   75  Temp:  98.1 F (36.7 C) 98.4 F (36.9 C) 98.1 F (36.7 C)  TempSrc:  Oral Oral Oral  Resp: 20 18 21 23   Height:      Weight:      SpO2: 100% 99% 98% 97%    Intake/Output Summary (Last 24 hours) at 10/11/11 0917 Last data filed at 10/11/11 0600  Gross per 24 hour  Intake    300 ml  Output   1100 ml  Net   -800 ml   Weight change:  Filed Weights   10/08/11 2000 10/09/11 0400 10/10/11 0500  Weight: 88 lb 2.9 oz (40 kg) 88 lb 2.9 oz (40 kg) 91 lb 14.9 oz (41.7 kg)     PHYSICAL EXAM General: Well developed, malnourished, in no acute distress. Head: Normocephalic, atraumatic.  Neck: Supple without bruits, JVD. Lungs:  Resp regular and unlabored, CTA. Heart: RRR, S1, S2, no S3, S4, or murmur. Abdomen: Soft, non-tender, non-distended, BS + x 4.  Extremities: No clubbing, cyanosis, edema.  Neuro: Alert and oriented X 3. Moves all extremities spontaneously. Psych: Normal affect.  LABS: CBC: Basename 10/10/11 0739 10/09/11 0322  WBC 7.8 9.7  NEUTROABS -- --  HGB 9.7* 9.9*  HCT 29.2* 30.4*  MCV 93.0 93.8  PLT 219 218   Basic Metabolic Panel: Basename 10/11/11 0627 10/10/11 0739  NA 141 139  K 3.7 4.0  CL 104 103  CO2 30 28  GLUCOSE 121* 98  BUN 19 15  CREATININE 1.13* 1.08  CALCIUM 9.3 9.4  MG -- 2.2  PHOS -- --   Liver Function Tests:No results found for this basename: AST:2,ALT:2,ALKPHOS:2,BILITOT:2,PROT:2,ALBUMIN:2 in the last 72 hours Cardiac Enzymes: Basename  10/09/11 1534 10/09/11 1054 10/09/11 0322  CKTOTAL 375* 453* 677*  CKMB 24.8* 32.6* 58.1*  CKMBINDEX -- -- --  TROPONINI >20.00* >20.00* >20.00*   BNP: No results found for this basename: probnp   Hemoglobin A1C:No results found for this basename: HGBA1C in the last 72 hours  Thyroid Function Tests: Basename 10/10/11 0739  TSH 0.922  T4TOTAL --  T3FREE --  Marko Plume --   TELE:  bigeminy      Radiology/Studies: Dg Chest Port 1 View 10/09/2011  *RADIOLOGY REPORT*  Clinical Data: Shortness of breath.  PORTABLE CHEST - 1 VIEW  Comparison: 10/08/2011.  Findings: Trachea is midline.  Heart size normal.  Hiatal hernia is noted.  Atherosclerotic calcification is seen in the aorta. Minimal left basilar atelectasis.  Lungs are otherwise clear.  No pleural fluid.  IMPRESSION: Minimal basilar atelectasis.  Original Report Authenticated By: Reyes Ivan, M.D.    Current Medications:     . aspirin  81 mg Oral Daily  . clopidogrel  75 mg Oral Q breakfast  . metoprolol tartrate  12.5 mg Oral BID  . multivitamin with minerals  1 tablet Oral Daily  . pantoprazole  40 mg Oral Q0600      . nitroGLYCERIN 10 mcg/min (10/08/11 2002)    ASSESSMENT AND PLAN:  1. CAD/ STEMI-pt has h/o CAD with stenosis before PDA treated medically since 2002. Presented on 10/08/11 as a code stemi with subsequent stenting of the PLA with Vision non DES stent. Currently, she is pain free. Will continue ASA, Plavix and BB. Add statin, check lipids and lfts.  2.  Chronic grade I diastolic CHF - on echo 2012 and 6/13. Per Dr. Earnestine Leys, there has been no clinical evidence of heart failure. She is euvolemic on assessment.  4. Chronic anemia -  Restart home dose multivitamin. Pt to f/u with primary PCP  5. PVCs- electrolytes normal. Patient is stable, will hold off any interventions for now.  6. Hyperglycemia-  Serum glucose elevated. A1c already ordered  7. Genella Rife- continue PPI  Plan - Tx stepdown or telemetry  and d/c in the next few days if does well.  Signed, Theodore Demark , PA-C 9:17 AM 10/11/2011   Patient seen and examined.  She looks much happier.  Nausea is improved.  No current chest pain.  Lungs are clear.  Agree with the assessment of Ms. Barrett.  Agree with transfer with plans for discharge possibly tomorrow or Sunday depending upon her ability to ambulate.

## 2011-10-11 NOTE — Progress Notes (Signed)
Patient has been confused this evening. She thinks that she is at home. She has been reoriented many, many times. I spoke with daughter over the phone about patient's mental status and she stated that normally she is not like this. I explained to the daughter that we were taking measures to keep her mother safe by changing her bed out for a lower one, padding on the floor, yellow arm bracelet and red socks. Bed alarm has been placed on and call bell is in reach with explanations about calling nurse from the call bell if the need arise to get out of bed. Patient continues to get out of bed without calling. Bed alarm has been sounding to alert staff when patient gets out of bed.

## 2011-10-12 LAB — BASIC METABOLIC PANEL
CO2: 30 mEq/L (ref 19–32)
Chloride: 102 mEq/L (ref 96–112)
GFR calc non Af Amer: 41 mL/min — ABNORMAL LOW (ref >90)
Glucose, Bld: 132 mg/dL — ABNORMAL HIGH (ref 70–99)
Potassium: 3.6 mEq/L (ref 3.5–5.1)
Sodium: 142 mEq/L (ref 135–145)

## 2011-10-12 LAB — CBC
Hemoglobin: 10.2 g/dL — ABNORMAL LOW (ref 12.0–15.0)
RBC: 3.3 MIL/uL — ABNORMAL LOW (ref 3.87–5.11)
WBC: 11 10*3/uL — ABNORMAL HIGH (ref 4.0–10.5)

## 2011-10-12 MED ORDER — ATORVASTATIN CALCIUM 80 MG PO TABS: 80.0000 mg | ORAL_TABLET | Freq: Every day | ORAL | Status: DC

## 2011-10-12 MED ORDER — LORAZEPAM 0.5 MG PO TABS: 0.5000 mg | ORAL_TABLET | Freq: Every evening | ORAL | Status: AC | PRN

## 2011-10-12 MED ORDER — CLOPIDOGREL BISULFATE 75 MG PO TABS: 75.0000 mg | ORAL_TABLET | Freq: Every day | ORAL | Status: DC

## 2011-10-12 MED ORDER — DIPHENHYDRAMINE HCL 25 MG PO CAPS: 25.0000 mg | ORAL_CAPSULE | Freq: Every evening | ORAL | Status: DC | PRN

## 2011-10-12 MED ORDER — ZOLPIDEM TARTRATE 10 MG PO TABS: 5.0000 mg | ORAL_TABLET | Freq: Every evening | ORAL | Status: DC | PRN

## 2011-10-12 MED ORDER — METOPROLOL TARTRATE 25 MG PO TABS
25.0000 mg | ORAL_TABLET | Freq: Two times a day (BID) | ORAL | Status: DC
  Administered 2011-10-12: 25 mg via ORAL
  Filled 2011-10-12: qty 1

## 2011-10-12 MED ORDER — PANTOPRAZOLE SODIUM 40 MG PO TBEC: 40.0000 mg | DELAYED_RELEASE_TABLET | Freq: Every day | ORAL | Status: DC

## 2011-10-12 MED ORDER — METOPROLOL TARTRATE 25 MG PO TABS: 25.0000 mg | ORAL_TABLET | Freq: Two times a day (BID) | ORAL | Status: DC

## 2011-10-12 MED ORDER — ASPIRIN 81 MG PO CHEW: 81.0000 mg | CHEWABLE_TABLET | Freq: Every day | ORAL | Status: DC

## 2011-10-12 NOTE — Progress Notes (Signed)
Bailey Mech, NP called in prescription for Nitroglycerin SL tablets at discharge

## 2011-10-12 NOTE — Progress Notes (Signed)
PROGRESS NOTE  Subjective:   Kristen Rodriguez is a 78yo Caucasian female with PMHx significant for CAD (NSTEMI 2002 s/p cardiac cath revealing tubular 70% stenosis before the PDA not believed to represent the culprit lesion, medically managed), chronic diastolic CHF (per 2012 echo, without clinical manifestation), chronic anemia, GERD, history of DVT (on tamoxifen) and breast cancer who was transported emergently to [facility] for posterior STEMI.   She had emergent PCI of her RCA and has been pain free.  She remains in the ICU - has orders for stepdown.  She has been ambulating in the halls without difficulty.  Objective:    Vital Signs:   Temp:  [97.7 F (36.5 C)-98.3 F (36.8 C)] 98 F (36.7 C) (06/29 0812) Pulse Rate:  [78-80] 80  (06/28 1600) Resp:  [16-20] 20  (06/29 0400) BP: (131-162)/(46-68) 146/68 mmHg (06/29 0400) SpO2:  [97 %-98 %] 98 % (06/29 0400) Weight:  [92 lb 2.4 oz (41.8 kg)] 92 lb 2.4 oz (41.8 kg) (06/29 0700)  Last BM Date: 10/07/11   24-hour weight change: Weight change:   Weight trends: Filed Weights   10/09/11 0400 10/10/11 0500 10/12/11 0700  Weight: 88 lb 2.9 oz (40 kg) 91 lb 14.9 oz (41.7 kg) 92 lb 2.4 oz (41.8 kg)    Intake/Output:  06/28 0701 - 06/29 0700 In: 1220 [P.O.:1220] Out: 640 [Urine:640]     Physical Exam: BP 146/68  Pulse 80  Temp 98 F (36.7 C) (Oral)  Resp 20  Ht 5' 2.5" (1.588 m)  Wt 92 lb 2.4 oz (41.8 kg)  BMI 16.59 kg/m2  SpO2 98%  General: Vital signs reviewed and noted. Well-developed, well-nourished, in no acute distress; alert, appropriate and cooperative .  Head: Normocephalic, atraumatic.  Eyes: conjunctivae/corneas clear.  EOM's intact.   Throat: normal  Neck: Supple. Normal carotids. No JVD  Lungs:  Clear to auscultation  Heart: Regular rate,  With normal  S1 S2. No murmurs, gallops or rubs  Abdomen:  Soft, non-tender, non-distended with normoactive bowel sounds. No hepatomegaly. No rebound/guarding. No abdominal  masses.  Extremities: Distal pedal pulses are 2+ .  No edema.    Neurologic: A&O X3, CN II - XII are grossly intact. Motor strength is 5/5 in the all 4 extremities.  Psych: Responds to questions appropriately with normal affect.    Labs: BMET:  Basename 10/12/11 0520 10/11/11 0627 10/10/11 0739  NA 142 141 --  K 3.6 3.7 --  CL 102 104 --  CO2 30 30 --  GLUCOSE 132* 121* --  BUN 25* 19 --  CREATININE 1.18* 1.13* --  CALCIUM 9.1 9.3 --  MG -- -- 2.2  PHOS -- -- --    Liver function tests:  [facility] 10/11/11 0627  AST 30  ALT 16  ALKPHOS 79  BILITOT 0.2*  PROT 6.0  ALBUMIN 3.1*   No results found for this basename: LIPASE:2,AMYLASE:2 in the last 72 hours  CBC:  Basename 10/12/11 0520 10/10/11 0739  WBC 11.0* 7.8  NEUTROABS -- --  HGB 10.2* 9.7*  HCT 31.2* 29.2*  MCV 94.5 93.0  PLT 251 219    Cardiac Enzymes:  Basename 10/09/11 1534 10/09/11 1054  CKTOTAL 375* 453*  CKMB 24.8* 32.6*  TROPONINI >20.00* >20.00*    Basename 10/11/11 0627  CHOL 188  HDL 64  LDLCALC 105*  TRIG 94  CHOLHDL 2.9    Basename 10/10/11 0739  TSH 0.922  T4TOTAL --  T3FREE --  THYROIDAB --  10/12/2011  Tele:  NSR, occasional PVCs  Medications:    Infusions:    . nitroGLYCERIN 10 mcg/min (10/08/11 2002)    Scheduled Medications:    . aspirin  81 mg Oral Daily  . atorvastatin  80 mg Oral q1800  . clopidogrel  75 mg Oral Q breakfast  . metoprolol tartrate  12.5 mg Oral BID  . multivitamin with minerals  1 tablet Oral Daily  . pantoprazole  40 mg Oral Q0600    Assessment/ Plan:    Acute MI, true posterior wall (10/09/2011)   S/p PCI to RCA  ANEMIA (04/27/2010) Stable, will be followed by primary md  Chronic diastolic heart failure (06/13/2010) Cont. Metoprolol  Weight loss, abnormal (08/17/2011) Plans per Primary md  PVCs (premature ventricular contractions) (10/10/2011) Stable   Disposition: to follow up with Dr. Andee Lineman. DC on  ASA Atorvastatin  80 Metoprolol 25 BID Plavix 75 NTG 0.4 mg PRN protonix 40 MVI      Length of Stay: 4  Vesta Mixer, Montez Hageman., MD, [facility] 10/12/2011, 8:23 AM Office [phone] Pager [phone]

## 2011-10-12 NOTE — Discharge Summary (Signed)
Physician Discharge Summary  Patient ID: Kristen Rodriguez MRN: [id number] DOB/AGE: 76-Jan-1925 76 y.o.  Admit date: 10/08/2011 Discharge date: 10/12/2011  Primary Discharge Diagnosis: NSTEMI(posterior) Secondary Discharge Diagnosis 1. Chronic Diastolic Heart Failure 2. Hypercholesterolemia 3. Hx of DVT 4. CAD  5. Chronic Anemia 6. Breast Cancer  Significant Diagnostic Studies: Coronary angiography:  Coronary dominance: right  Left mainstem: Calcified with 20% distal narrowing.  Left anterior descending (LAD): Heavily calcified with 40% before the diagonal takeoff and 50% segmental after. The distal vessel is large and wraps the apex. The diagonal is large with 50% proximal segmental plaque.  Left circumflex (LCx): small with 30-50% segmental plaque proximally  Right coronary artery (RCA): Large caliber vessel with heavy calcification. There is an 80% proximal focal lesion that was in a large territory, stented with 0% residual post. The mid vessel was heavily calcified with diffuse 50-60% calcified narrowing. It did not appear very favorable for intervention. The small acute marginal has a 90% stenosis. The first PDA branch is small without narrowing. The second PDA is mid sized and has 80% proximal and 90% mid narrowing. The continuation branch is totally occluded and is the infarct site. Following PTCA, there is 20% residual with TIMI 3 flow. The PLA is very large.  Left ventriculography: Left ventricular systolic function is slightly reduced, LVEF is estimated at 50%, there is mild inferior hypokinesis, there is no significant mitral regurgitation  PCI Data:  Vessel - RCA/Segment - 3,2  Percent Stenosis (pre) 100, 80  TIMI-flow 0, 2  Stent none for 3, 3.5 by 15 Vision for prox  Percent Stenosis (post) 20, 0%  TIMI-flow (post) 3,3   Consults: None  Hospital Course: Kristen Rodriguez is an 78 year old Caucasian female with known history of CAD, who was admitted for non-ST elevated posterior MI.  She had emergent cardiac catheterization per Dr. Riley Kill on 10/08/2011 revealing a percent proximal focal lesion of the right coronary artery it was in a large territory. He was stented with a vision non-DES stent 15 mm x 3.5 mm. LVEF reveals slightly reduced LV function estimated at 50% with mild inferior hypokinesis no significant mitral regurg. She is placed on dual antiplatelet therapy with Plavix and aspirin along with metoprolol 25 mg twice a day. She has tolerated procedure and postoperative rehabilitation. She was anxious to return home and was pain free. She will continue on aspirin or statin metoprolol Plavix, Protonix, and nitroglycerin when necessary. He was seen and examined by Dr. Kristeen Miss on discharge and was found to be stable. She will see Dr. again in the office in 2 weeks.   Discharge Exam: Blood pressure 129/32, pulse 80, temperature 98 F (36.7 C), temperature source Oral, resp. rate 20, height 5' 2.5" (1.588 m), weight 92 lb 2.4 oz (41.8 kg), SpO2 98.00%. Labs:   Lab Results  Component Value Date   WBC 11.0* 10/12/2011   HGB 10.2* 10/12/2011   HCT 31.2* 10/12/2011   MCV 94.5 10/12/2011   PLT 251 10/12/2011    Lab 10/12/11 0520 10/11/11 0627  NA 142 --  K 3.6 --  CL 102 --  CO2 30 --  BUN 25* --  CREATININE 1.18* --  CALCIUM 9.1 --  PROT -- 6.0  BILITOT -- 0.2*  ALKPHOS -- 79  ALT -- 16  AST -- 30  GLUCOSE 132* --   Lab Results  Component Value Date   CKTOTAL 375* 10/09/2011   CKMB 24.8* 10/09/2011   TROPONINI >20.00* 10/09/2011  Lab Results  Component Value Date   CHOL 188 10/11/2011   Lab Results  Component Value Date   HDL 64 10/11/2011   Lab Results  Component Value Date   LDLCALC 105* 10/11/2011   Lab Results  Component Value Date   TRIG 94 10/11/2011   Lab Results  Component Value Date   CHOLHDL 2.9 10/11/2011   No results found for this basename: LDLDIRECT      Radiology: Dg Chest Port 1 View  10/09/2011  *RADIOLOGY REPORT*  Clinical  Data: Shortness of breath.  PORTABLE CHEST - 1 VIEW  Comparison: 10/08/2011.  Findings: Trachea is midline.  Heart size normal.  Hiatal hernia is noted.  Atherosclerotic calcification is seen in the aorta. Minimal left basilar atelectasis.  Lungs are otherwise clear.  No pleural fluid.  IMPRESSION: Minimal basilar atelectasis.  Original Report Authenticated By: Reyes Ivan, M.D.    ZOX:WRUEA rhythm with frequent PVC's, Nonspecific T-wave abnormalities. Rate of 70 bpm  FOLLOW UP PLANS AND APPOINTMENTS Discharge Orders    Future Orders Please Complete By Expires   Amb Referral to Cardiac Rehabilitation      Comments:   Pt agrees to Outpt. CRP in [place] will send referral..   Diet - low sodium heart healthy      Increase activity slowly      Call MD for:  severe uncontrolled pain      Call MD for:  extreme fatigue      Call MD for:  redness, tenderness, or signs of infection (pain, swelling, redness, odor or green/yellow discharge around incision site)        Medication List  As of 10/12/2011 10:01 AM   TAKE these medications         aspirin 81 MG chewable tablet   Chew 1 tablet (81 mg total) by mouth daily.      atorvastatin 80 MG tablet   Commonly known as: LIPITOR   Take 1 tablet (80 mg total) by mouth daily at 6 PM.      clopidogrel 75 MG tablet   Commonly known as: PLAVIX   Take 1 tablet (75 mg total) by mouth daily with breakfast.      diphenhydrAMINE 25 mg capsule   Commonly known as: BENADRYL   Take 1 capsule (25 mg total) by mouth at bedtime as needed for sleep.      HYDROcodone-acetaminophen 5-500 MG per tablet   Commonly known as: VICODIN   Take 1 tablet by mouth every 6 (six) hours as needed. For pain.      LORazepam 0.5 MG tablet   Commonly known as: ATIVAN   Take 1 tablet (0.5 mg total) by mouth at bedtime as needed.      metoprolol tartrate 25 MG tablet   Commonly known as: LOPRESSOR   Take 1 tablet (25 mg total) by mouth 2 (two) times daily.       MULTIVITAMIN PO   Take 1 tablet by mouth daily.      pantoprazole 40 MG tablet   Commonly known as: PROTONIX   Take 1 tablet (40 mg total) by mouth daily at 6 (six) AM.      zolpidem 10 MG tablet   Commonly known as: AMBIEN   Take 0.5 tablets (5 mg total) by mouth at bedtime as needed. For insomnia.               Time spent with patient to include physician time:35 minutes Signed: Joni Reining 10/12/2011,  10:01 AM Co-Sign MD   Attending Note:   The patient was seen and examined.  Agree with assessment and plan as noted above.  See my note from earlier today.  Vesta Mixer, Montez Hageman., MD, [facility] 10/12/2011, 10:17 AM

## 2011-10-12 NOTE — Progress Notes (Signed)
CARDIAC REHAB PHASE I   PRE:  Rate/Rhythm: 76 SR PVC's  BP:  Supine:   Sitting: 135/46  Standing:    SaO2: 97 RA  MODE:  Ambulation: 350 ft   POST:  Rate/Rhythem: 96 SR PVC's  BP:  Supine:   Sitting: 166/58  Standing:    SaO2: 96 RA 1150-1230 Late entry from 10/11/2011 Assisted X 1 and used walker to ambulate. Gait steady with walker. BP after walk 166/58. No c/o of cp or SOB with walking. Pt back to recliner after walk with call  light  in reach and daughter  Present. Completed MI education with pt and daughter. Pt agrees to Outpt. CRP in [place], will send referral.  Kristen Rodriguez

## 2011-10-12 NOTE — Progress Notes (Addendum)
CARDIAC REHAB PHASE I   PRE:  Rate/Rhythm: 79 SR PVC's  BP:  Supine:   Sitting: 129/32  Standing:    SaO2: 93 RA  MODE:  Ambulation: 700 ft   POST:  Rate/Rhythem: 101 ST PVC's  BP:  Supine:   Sitting: 152/47  Standing:    SaO2: 94 RA 0910-0945 Tolerated ambulation well without c/o of cp or SOB. Pt does c/o of legs feeling shaky  weak feeling. She was able to walk 700 feet. Pt's daughter plan to stay with her at discharge. Pt to recliner after walk with call light in reach. She does have a walker  and cane at home. Encouraged her to use walker until she regains her strength.  Kristen Rodriguez

## 2011-10-24 ENCOUNTER — Telehealth: Payer: Self-pay | Admitting: *Deleted

## 2011-10-24 NOTE — Telephone Encounter (Addendum)
Daugher called stating that patient wanted to start back some supplements.  Stated [facility] that she should check with MD prior to doing this.  See list below.  Does have OV scheduled for 7/26 with GS.    Restora for anemia & appetite enhancer Miralax  Preser-vision Phayzyme

## 2011-10-25 ENCOUNTER — Telehealth: Payer: Self-pay | Admitting: *Deleted

## 2011-10-25 NOTE — Telephone Encounter (Signed)
Pt is cleared to resume these supplements

## 2011-10-25 NOTE — Telephone Encounter (Signed)
Left message to return call 

## 2011-10-28 ENCOUNTER — Other Ambulatory Visit: Payer: Self-pay | Admitting: *Deleted

## 2011-10-28 NOTE — Progress Notes (Signed)
Error

## 2011-10-28 NOTE — Telephone Encounter (Signed)
Daughter Alvino Chapel) notified via voice mail.

## 2011-11-08 ENCOUNTER — Ambulatory Visit (INDEPENDENT_AMBULATORY_CARE_PROVIDER_SITE_OTHER): Payer: 59 | Admitting: Physician Assistant

## 2011-11-08 ENCOUNTER — Encounter: Payer: Self-pay | Admitting: Physician Assistant

## 2011-11-08 VITALS — BP 117/70 | HR 60 | Ht 62.0 in | Wt 97.0 lb

## 2011-11-08 DIAGNOSIS — E785 Hyperlipidemia, unspecified: Secondary | ICD-10-CM

## 2011-11-08 DIAGNOSIS — I5032 Chronic diastolic (congestive) heart failure: Secondary | ICD-10-CM

## 2011-11-08 DIAGNOSIS — I2510 Atherosclerotic heart disease of native coronary artery without angina pectoris: Secondary | ICD-10-CM

## 2011-11-08 DIAGNOSIS — E782 Mixed hyperlipidemia: Secondary | ICD-10-CM

## 2011-11-08 NOTE — Assessment & Plan Note (Signed)
Continue full dose Lipitor. Recent LDL 105. Reassess lipid status in 8 more weeks. Target LDL 70 or less, if feasible.

## 2011-11-08 NOTE — Patient Instructions (Signed)
   Labs - due in 8 weeks for fasting lipid & liver panel  Office will contact with results  Referral to Cardiac Rehab Follow up in  3 months

## 2011-11-08 NOTE — Progress Notes (Signed)
Primary Cardiologist: Lewayne Bunting, MD   HPI: Patient presents in followup from [facility], following recent presentation with acute inferior NST EMI. She underwent emergent PCI, by Dr. Riley Kill, with BMS 80% proximal RCA, and PTCA 100% occluded PLA (culprit lesion). Mild LVD (EF 50%), with mild inferior HK.  Lipid profile: Total Cholesterol 188, HDL 64, LDL 105.  Patient presents today reporting no recurrent chest pain. She is tolerating her medications, has been ambulating around the home, and reports no complications of the right groin incision site.    Allergies  Allergen Reactions  . Codeine Nausea Only  . Penicillins Other (See Comments)    unknown  . Sulfa Antibiotics Nausea Only    Current Outpatient Prescriptions  Medication Sig Dispense Refill  . aspirin 81 MG chewable tablet Chew 1 tablet (81 mg total) by mouth daily.      Marland Kitchen atorvastatin (LIPITOR) 80 MG tablet Take 1 tablet (80 mg total) by mouth daily at 6 PM.  30 tablet  6  . clopidogrel (PLAVIX) 75 MG tablet Take 1 tablet (75 mg total) by mouth daily with breakfast.  30 tablet  10  . diphenhydrAMINE (BENADRYL) 25 mg capsule Take 1 capsule (25 mg total) by mouth at bedtime as needed for sleep.  30 capsule  6  . HYDROcodone-acetaminophen (VICODIN) 5-500 MG per tablet Take 1 tablet by mouth every 6 (six) hours as needed. For pain.      . metoprolol tartrate (LOPRESSOR) 25 MG tablet Take 1 tablet (25 mg total) by mouth 2 (two) times daily.  60 tablet  10  . Multiple Vitamin (MULTIVITAMIN PO) Take 1 tablet by mouth daily.        . pantoprazole (PROTONIX) 40 MG tablet Take 1 tablet (40 mg total) by mouth daily at 6 (six) AM.  30 tablet  11  . zolpidem (AMBIEN) 10 MG tablet Take 0.5 tablets (5 mg total) by mouth at bedtime as needed. For insomnia.  30 tablet  1    Past Medical History  Diagnosis Date  . Precordial pain   . Old myocardial infarction   . Hypopotassemia   . Esophageal reflux   . Allergic rhinitis, cause unspecified     . Pure hypercholesterolemia   . Unspecified essential hypertension   . Family history of ischemic heart disease   . CAD (coronary artery disease) 07/2000    Status Post NSTEMI, 60% mid RCA lesion treated medically  . Breast cancer 2006  . DVT (deep venous thrombosis) 2006  . Macular degeneration   . CHF (congestive heart failure)   . H/O hiatal hernia     Past Surgical History  Procedure Date  . Lumbar laminectomy   . Cataract extraction, bilateral   . Abdominal hysterectomy   . Corneal implant     left  . Cardiac catheterization 2002    Without PCI    History   Social History  . Marital Status: Widowed    Spouse Name: N/A    Number of Children: N/A  . Years of Education: N/A   Occupational History  . RETIRED    Social History Main Topics  . Smoking status: Never Smoker   . Smokeless tobacco: Never Used  . Alcohol Use: No  . Drug Use: No  . Sexually Active: No   Other Topics Concern  . Not on file   Social History Narrative  . No narrative on file    Family History  Problem Relation Age of Onset  . Heart  attack Father 69    Fatal  . Cancer      ROS: no nausea, vomiting; no fever, chills; no melena, hematochezia; no claudication  PHYSICAL EXAM: Ht 5\' 2"  (1.575 m)  Wt 97 lb (43.999 kg)  BMI 17.74 kg/m2 GENERAL: 78 year old female, sitting upright; NAD HEENT: NCAT, PERRLA, EOMI; sclera clear; no xanthelasma NECK: palpable bilateral carotid pulses, no bruits; no JVD; no TM LUNGS: CTA bilaterally CARDIAC: RRR (S1, S2); no significant murmurs; no rubs or gallops ABDOMEN: soft, non-tender; intact BS EXTREMETIES: no significant peripheral edema SKIN: warm/dry; no obvious rash/lesions MUSCULOSKELETAL: no joint deformity NEURO: no focal deficit; NL affect   EKG: reviewed and available in Electronic Records   ASSESSMENT & PLAN:  No problem-specific assessment & plan notes found for this encounter.   Gene Danile Trier, PAC

## 2011-11-08 NOTE — Assessment & Plan Note (Signed)
Euvolemic by history and exam 

## 2011-11-08 NOTE — Assessment & Plan Note (Signed)
Continue current medication regimen, including DAP for at least one year. Will refer patient for cardiac rehabilitation. Of note, I advised patient to defer consideration for "hammertoe" surgery for the next few months, in light of recent myocardial infarction.

## 2011-11-08 NOTE — Telephone Encounter (Signed)
error 

## 2011-11-30 ENCOUNTER — Inpatient Hospital Stay (HOSPITAL_COMMUNITY)
Admission: EM | Admit: 2011-11-30 | Discharge: 2011-12-04 | DRG: 481 | Disposition: A | Discharge status: Skilled Nursing Facility | Payer: Medicare Other | Attending: Internal Medicine | Admitting: Internal Medicine

## 2011-11-30 ENCOUNTER — Emergency Department (HOSPITAL_COMMUNITY): Payer: Medicare Other

## 2011-11-30 ENCOUNTER — Encounter (HOSPITAL_COMMUNITY): Payer: Self-pay | Admitting: Emergency Medicine

## 2011-11-30 DIAGNOSIS — Y9289 Other specified places as the place of occurrence of the external cause: Secondary | ICD-10-CM

## 2011-11-30 DIAGNOSIS — G4700 Insomnia, unspecified: Secondary | ICD-10-CM

## 2011-11-30 DIAGNOSIS — I82409 Acute embolism and thrombosis of unspecified deep veins of unspecified lower extremity: Secondary | ICD-10-CM

## 2011-11-30 DIAGNOSIS — W010XXA Fall on same level from slipping, tripping and stumbling without subsequent striking against object, initial encounter: Secondary | ICD-10-CM | POA: Diagnosis present

## 2011-11-30 DIAGNOSIS — K219 Gastro-esophageal reflux disease without esophagitis: Secondary | ICD-10-CM | POA: Diagnosis present

## 2011-11-30 DIAGNOSIS — I2510 Atherosclerotic heart disease of native coronary artery without angina pectoris: Secondary | ICD-10-CM

## 2011-11-30 DIAGNOSIS — W19XXXA Unspecified fall, initial encounter: Secondary | ICD-10-CM

## 2011-11-30 DIAGNOSIS — F411 Generalized anxiety disorder: Secondary | ICD-10-CM | POA: Diagnosis present

## 2011-11-30 DIAGNOSIS — Z853 Personal history of malignant neoplasm of breast: Secondary | ICD-10-CM

## 2011-11-30 DIAGNOSIS — S72143A Displaced intertrochanteric fracture of unspecified femur, initial encounter for closed fracture: Principal | ICD-10-CM | POA: Diagnosis present

## 2011-11-30 DIAGNOSIS — R634 Abnormal weight loss: Secondary | ICD-10-CM

## 2011-11-30 DIAGNOSIS — N390 Urinary tract infection, site not specified: Secondary | ICD-10-CM | POA: Diagnosis present

## 2011-11-30 DIAGNOSIS — R9430 Abnormal result of cardiovascular function study, unspecified: Secondary | ICD-10-CM

## 2011-11-30 DIAGNOSIS — I5032 Chronic diastolic (congestive) heart failure: Secondary | ICD-10-CM

## 2011-11-30 DIAGNOSIS — D649 Anemia, unspecified: Secondary | ICD-10-CM

## 2011-11-30 DIAGNOSIS — I10 Essential (primary) hypertension: Secondary | ICD-10-CM | POA: Diagnosis present

## 2011-11-30 DIAGNOSIS — I252 Old myocardial infarction: Secondary | ICD-10-CM

## 2011-11-30 DIAGNOSIS — D509 Iron deficiency anemia, unspecified: Secondary | ICD-10-CM | POA: Diagnosis present

## 2011-11-30 DIAGNOSIS — S72002A Fracture of unspecified part of neck of left femur, initial encounter for closed fracture: Secondary | ICD-10-CM

## 2011-11-30 DIAGNOSIS — R51 Headache: Secondary | ICD-10-CM

## 2011-11-30 DIAGNOSIS — Z9861 Coronary angioplasty status: Secondary | ICD-10-CM

## 2011-11-30 DIAGNOSIS — I238 Other current complications following acute myocardial infarction: Secondary | ICD-10-CM

## 2011-11-30 DIAGNOSIS — S72009A Fracture of unspecified part of neck of unspecified femur, initial encounter for closed fracture: Secondary | ICD-10-CM | POA: Diagnosis present

## 2011-11-30 DIAGNOSIS — I2129 ST elevation (STEMI) myocardial infarction involving other sites: Secondary | ICD-10-CM

## 2011-11-30 DIAGNOSIS — I493 Ventricular premature depolarization: Secondary | ICD-10-CM

## 2011-11-30 DIAGNOSIS — E785 Hyperlipidemia, unspecified: Secondary | ICD-10-CM

## 2011-11-30 DIAGNOSIS — I509 Heart failure, unspecified: Secondary | ICD-10-CM | POA: Diagnosis present

## 2011-11-30 DIAGNOSIS — E876 Hypokalemia: Secondary | ICD-10-CM | POA: Diagnosis not present

## 2011-11-30 LAB — CBC WITH DIFFERENTIAL/PLATELET
Basophils Absolute: 0.1 K/uL (ref 0.0–0.1)
Basophils Relative: 0 % (ref 0–1)
Eosinophils Absolute: 0.4 K/uL (ref 0.0–0.7)
Eosinophils Relative: 4 % (ref 0–5)
HCT: 32.7 % — ABNORMAL LOW (ref 36.0–46.0)
Hemoglobin: 10.7 g/dL — ABNORMAL LOW (ref 12.0–15.0)
Lymphocytes Relative: 15 % (ref 12–46)
Lymphs Abs: 1.7 K/uL (ref 0.7–4.0)
MCH: 30.7 pg (ref 26.0–34.0)
MCHC: 32.7 g/dL (ref 30.0–36.0)
MCV: 93.7 fL (ref 78.0–100.0)
Monocytes Absolute: 1 K/uL (ref 0.1–1.0)
Monocytes Relative: 9 % (ref 3–12)
Neutro Abs: 8.6 K/uL — ABNORMAL HIGH (ref 1.7–7.7)
Neutrophils Relative %: 73 % (ref 43–77)
Platelets: 242 K/uL (ref 150–400)
RBC: 3.49 MIL/uL — ABNORMAL LOW (ref 3.87–5.11)
RDW: 14.1 % (ref 11.5–15.5)
WBC: 11.8 K/uL — ABNORMAL HIGH (ref 4.0–10.5)

## 2011-11-30 LAB — URINALYSIS, ROUTINE W REFLEX MICROSCOPIC
Bilirubin Urine: NEGATIVE
Glucose, UA: NEGATIVE mg/dL
Ketones, ur: NEGATIVE mg/dL
Nitrite: NEGATIVE
Protein, ur: 100 mg/dL — AB
Specific Gravity, Urine: 1.016 (ref 1.005–1.030)
Urobilinogen, UA: 0.2 mg/dL (ref 0.0–1.0)
pH: 7.5 (ref 5.0–8.0)

## 2011-11-30 LAB — POCT I-STAT TROPONIN I

## 2011-11-30 LAB — APTT: aPTT: 34 s (ref 24–37)

## 2011-11-30 LAB — URINE MICROSCOPIC-ADD ON

## 2011-11-30 LAB — BASIC METABOLIC PANEL
BUN: 16 mg/dL (ref 6–23)
Calcium: 9.1 mg/dL (ref 8.4–10.5)
Chloride: 102 mEq/L (ref 96–112)
Creatinine, Ser: 1.08 mg/dL (ref 0.50–1.10)
GFR calc Af Amer: 53 mL/min — ABNORMAL LOW (ref >90)
GFR calc non Af Amer: 45 mL/min — ABNORMAL LOW (ref >90)

## 2011-11-30 MED ORDER — HYDRALAZINE HCL 10 MG PO TABS
5.0000 mg | ORAL_TABLET | Freq: Four times a day (QID) | ORAL | Status: DC | PRN
  Administered 2011-12-03: 5 mg via ORAL
  Filled 2011-11-30 (×2): qty 1

## 2011-11-30 NOTE — H&P (Addendum)
[facility] History and Physical  Kristen Rodriguez:[id number] DOB: [birth year] DOA: 11/30/2011  Referring physician: Madelin Headings PCP: Samuel Jester, DO  Cardiologist: [name]  Chief Complaint: fall  HPI:   78 yo female with hx of CAD s/p ptca/stent RCA 10/08/2011, ?CHF, R THA, Wt loss (+ elevation in CEA), h/o breast cancer,  Presents with c/o mechanical fall leaving [place] going to her car.  In ED discovered to have L intertrochanteric hip fracture, and uti. Pt is also noted to have mild anemia.   Orthopedics has been consulted by ED, and expect to perform surgery at about 10:30am tomorrow morning. They are aware of recent cardiac stent and fact that she is on plavix, asa.     Review of Systems:  Negative for all organ systems except for + above.   Past Medical History  Diagnosis Date  . Precordial pain   . Old myocardial infarction   . Hypopotassemia   . Esophageal reflux   . Allergic rhinitis, cause unspecified   . Pure hypercholesterolemia   . Unspecified essential hypertension   . Family history of ischemic heart disease   . CAD (coronary artery disease) 07/2000    Status Post NSTEMI, 60% mid RCA lesion treated medically  . Breast cancer 2006  . DVT (deep venous thrombosis) 2006  . Macular degeneration   . CHF (congestive heart failure)   . H/O hiatal hernia    Past Surgical History  Procedure Date  . Lumbar laminectomy   . Cataract extraction, bilateral   . Abdominal hysterectomy   . Corneal implant     left  . Cardiac catheterization 2002    Without PCI  . Cardiac catheterization 10/08/2011    1.  Inferior MI with occlusion of the PLA with reperfusion with POBA, stent placed proximally RCA  for high grade disease  . Total hip arthroplasty 02/2010    Right hip by Kinnie Scales Polmeritis    . Colonoscopy 09/17/2005    Tubular adenoma fragments   Social History:  reports that she has never smoked. She has never used smokeless tobacco. She reports that she  does not drink alcohol or use illicit drugs. Pt lives at home by self prior to this admission was able to perform all ADLS independently   Allergies  Allergen Reactions  . Codeine Nausea Only  . Penicillins Other (See Comments)    unknown  . Sulfa Antibiotics Nausea Only    Family History  Problem Relation Age of Onset  . Heart attack Father 2    Fatal  . Cancer    (be sure to complete)  Prior to Admission medications   Medication Sig Start Date End Date Taking? Authorizing Provider  aspirin 81 MG chewable tablet Chew 81 mg by mouth daily. 10/12/11 10/11/12 Yes Jodelle Gross, NP  atorvastatin (LIPITOR) 80 MG tablet Take 80 mg by mouth daily at 6 PM. 10/12/11 10/11/12 Yes Jodelle Gross, NP  clopidogrel (PLAVIX) 75 MG tablet Take 75 mg by mouth daily with breakfast. 10/12/11 10/11/12 Yes Jodelle Gross, NP  diphenhydrAMINE (BENADRYL) 25 mg capsule Take 25 mg by mouth at bedtime. 10/12/11 10/21/12 Yes Jodelle Gross, NP  HYDROcodone-acetaminophen (VICODIN) 5-500 MG per tablet Take 1 tablet by mouth every 6 (six) hours as needed. For pain.   Yes Historical Provider, MD  LORazepam (ATIVAN) 0.5 MG tablet Take 0.5 mg by mouth at bedtime.   Yes Historical Provider, MD  metoprolol tartrate (LOPRESSOR) 25 MG tablet Take 25 mg  by mouth 2 (two) times daily. 10/12/11 10/11/12 Yes Jodelle Gross, NP  Multiple Vitamin (MULTIVITAMIN PO) Take 1 tablet by mouth daily.     Yes Historical Provider, MD  pantoprazole (PROTONIX) 40 MG tablet Take 40 mg by mouth daily at 6 (six) AM. 10/12/11 10/11/12 Yes Jodelle Gross, NP  polyethylene glycol (MIRALAX / Ethelene Hal) packet Take 17 g by mouth daily.   Yes Historical Provider, MD  Probiotic Product (RESTORA) CAPS Take 1 capsule by mouth daily.   Yes Historical Provider, MD   Physical Exam: Filed Vitals:   11/30/11 1840 11/30/11 1941  BP: 200/70 159/87  Pulse: 114 80  Temp: 98 F (36.7 C)   TempSrc: Oral   Resp: 22 12  SpO2: 100% 99%      General:  Frail thin white female elderly  Eyes: anicteric, eomi, no nystagmus  ENT: mm dry  Neck: no jvd, no bruit, no tm, no adenopathy  Cardiovascular: rrr s1, s2   Respiratory: ctab  Abdomen: soft, nt, nd, +bs  Skin: no rash  Musculoskeletal: left leg abducted slightly   Psychiatric: axox3  Neurologic: nonfocal, cn2-12 intact, reflexes 2+ symmetric, diffuse with downgoing toes bilaterallyl.   Labs on Admission:  Basic Metabolic Panel:  Lab 11/30/11 1191  NA 137  K 3.4*  CL 102  CO2 26  GLUCOSE 125*  BUN 16  CREATININE 1.08  CALCIUM 9.1  MG --  PHOS --   Liver Function Tests: No results found for this basename: AST:5,ALT:5,ALKPHOS:5,BILITOT:5,PROT:5,ALBUMIN:5 in the last 168 hours No results found for this basename: LIPASE:5,AMYLASE:5 in the last 168 hours No results found for this basename: AMMONIA:5 in the last 168 hours CBC:  Lab 11/30/11 1859  WBC 11.8*  NEUTROABS 8.6*  HGB 10.7*  HCT 32.7*  MCV 93.7  PLT 242   Cardiac Enzymes: No results found for this basename: CKTOTAL:5,CKMB:5,CKMBINDEX:5,TROPONINI:5 in the last 168 hours  BNP (last 3 results) No results found for this basename: PROBNP:3 in the last 8760 hours CBG: No results found for this basename: GLUCAP:5 in the last 168 hours  Radiological Exams on Admission: Dg Chest 1 View  11/30/2011  *RADIOLOGY REPORT*  Clinical Data: Fall.  CHEST - 1 VIEW  Comparison: October 09, 2011.  Findings: Cardiomediastinal silhouette appears normal.  No acute pulmonary disease is noted.  Surgical clips are noted in left axillary region.  IMPRESSION: No acute cardiopulmonary abnormality seen.  Original Report Authenticated By: Venita Sheffield., M.D.   Dg Hip Complete Left  11/30/2011  *RADIOLOGY REPORT*  Clinical Data: fall, deformity.  LEFT HIP - COMPLETE 2+ VIEW  Comparison: None.  Findings: There is a left femoral intertrochanteric fracture with mild varus angulation.  No subluxation or dislocation.   Hardware noted within the right hip from remote injury.  IMPRESSION: Left intertrochanteric femoral fracture.  Original Report Authenticated By: Cyndie Chime, M.D.    EKG: Independently reviewed.   Assessment/Plan Principal Problem:  *Hip fracture Active Problems:  UTI (lower urinary tract infection)  Fall  1. L intertrochanteric hip fracture:  Appreciate ortho input, npo , pain control with morhpine, pt will need bone density test as outpatient,  Check vit D 25-oh level due to presumed osteoporosis 2. Fall mechanical: PT evaluation please 3. Uti:  tx with levaquin 500mg  iv qday 4. Anemia: check b12, folate, iron studies, esr, hemeoccult stool,  Type and screen, repeat cbc in am 5. Wt loss: pt has CEA elevation and hx of tubular adenoma fragment on colonoscopy  2007,  Recommend further evaluation with colonoscopy as outpatient 6. CAD s/p MI, RCA stent: cont asa, plavix,  Check 1 more set of cardiac markers this am.  Pt at moderate risk but benefit of surgery outweighs risk at this point in time. I think it would be prudent to proceed with surgery.  7. Hypokalemia: replete potassium and recheck cmp in am 8. H/o CHF?:  Please monitor I/O closely,  Hydrating gentley with normal saline due to fact that pt is npo.  9. Gerd: cont protonix  Note: cardiology consulted to follow due to recent hx of stent, we appreciate their input   Code Status: Full code Family Communication: daughter  Jonette Mate Disposition Plan: expect rehab   Time spent: 45 minutes  Pearson Grippe [facility] Pager [phone]  If 7PM-7AM, please contact night-coverage [email or web address] Password [email or web address] 11/30/2011, 10:06 PM

## 2011-11-30 NOTE — Consult Note (Signed)
Reason for Consult:Left hip pain from mechanical fall Referring Physician: Dr. Selena Batten  HPI: Kristen Rodriguez is an 78 y.o. female who slipped in rain sustaining left hip injury with inability to bear weight. Brought to [facility] ED and found to have left intertrochanteric hip fracture  Past Medical History  Diagnosis Date  . Precordial pain   . Old myocardial infarction   . Hypopotassemia   . Esophageal reflux   . Allergic rhinitis, cause unspecified   . Pure hypercholesterolemia   . Unspecified essential hypertension   . Family history of ischemic heart disease   . CAD (coronary artery disease) 07/2000    Status Post NSTEMI, 60% mid RCA lesion treated medically  . Breast cancer 2006  . DVT (deep venous thrombosis) 2006  . Macular degeneration   . CHF (congestive heart failure)   . H/O hiatal hernia     Past Surgical History  Procedure Date  . Lumbar laminectomy   . Cataract extraction, bilateral   . Abdominal hysterectomy   . Corneal implant     left  . Cardiac catheterization 2002    Without PCI  . Cardiac catheterization 10/08/2011    1.  Inferior MI with occlusion of the PLA with reperfusion with POBA, stent placed proximally RCA  for high grade disease  . Total hip arthroplasty 02/2010    Right hip by Kinnie Scales Polmeritis      Family History  Problem Relation Age of Onset  . Heart attack Father 61    Fatal  . Cancer      Social History:  reports that she has never smoked. She has never used smokeless tobacco. She reports that she does not drink alcohol or use illicit drugs.  Allergies:  Allergies  Allergen Reactions  . Codeine Nausea Only  . Penicillins Other (See Comments)    unknown  . Sulfa Antibiotics Nausea Only    Medications: I have reviewed the patient's current medications.  Results for orders placed during the hospital encounter of 11/30/11 (from the past 48 hour(s))  URINALYSIS, ROUTINE W REFLEX MICROSCOPIC     Status: Abnormal   Collection Time   11/30/11   6:44 PM      Component Value Range Comment   Color, Urine YELLOW  YELLOW    APPearance TURBID (*) CLEAR    Specific Gravity, Urine 1.016  1.005 - 1.030    pH 7.5  5.0 - 8.0    Glucose, UA NEGATIVE  NEGATIVE mg/dL    Hgb urine dipstick LARGE (*) NEGATIVE    Bilirubin Urine NEGATIVE  NEGATIVE    Ketones, ur NEGATIVE  NEGATIVE mg/dL    Protein, ur 161 (*) NEGATIVE mg/dL    Urobilinogen, UA 0.2  0.0 - 1.0 mg/dL    Nitrite NEGATIVE  NEGATIVE    Leukocytes, UA LARGE (*) NEGATIVE   URINE MICROSCOPIC-ADD ON     Status: Abnormal   Collection Time   11/30/11  6:44 PM      Component Value Range Comment   Squamous Epithelial / LPF MANY (*) RARE    WBC, UA TOO NUMEROUS TO COUNT  <3 WBC/hpf    RBC / HPF 11-20  <3 RBC/hpf    Bacteria, UA MANY (*) RARE   CBC WITH DIFFERENTIAL     Status: Abnormal   Collection Time   11/30/11  6:59 PM      Component Value Range Comment   WBC 11.8 (*) 4.0 - 10.5 K/uL    RBC 3.49 (*)  3.87 - 5.11 MIL/uL    Hemoglobin 10.7 (*) 12.0 - 15.0 g/dL    HCT 16.1 (*) 09.6 - 46.0 %    MCV 93.7  78.0 - 100.0 fL    MCH 30.7  26.0 - 34.0 pg    MCHC 32.7  30.0 - 36.0 g/dL    RDW 04.5  40.9 - 81.1 %    Platelets 242  150 - 400 K/uL    Neutrophils Relative 73  43 - 77 %    Neutro Abs 8.6 (*) 1.7 - 7.7 K/uL    Lymphocytes Relative 15  12 - 46 %    Lymphs Abs 1.7  0.7 - 4.0 K/uL    Monocytes Relative 9  3 - 12 %    Monocytes Absolute 1.0  0.1 - 1.0 K/uL    Eosinophils Relative 4  0 - 5 %    Eosinophils Absolute 0.4  0.0 - 0.7 K/uL    Basophils Relative 0  0 - 1 %    Basophils Absolute 0.1  0.0 - 0.1 K/uL   BASIC METABOLIC PANEL     Status: Abnormal   Collection Time   11/30/11  6:59 PM      Component Value Range Comment   Sodium 137  135 - 145 mEq/L    Potassium 3.4 (*) 3.5 - 5.1 mEq/L    Chloride 102  96 - 112 mEq/L    CO2 26  19 - 32 mEq/L    Glucose, Bld 125 (*) 70 - 99 mg/dL    BUN 16  6 - 23 mg/dL    Creatinine, Ser 9.14  0.50 - 1.10 mg/dL    Calcium 9.1  8.4 -  10.5 mg/dL    GFR calc non Af Amer 45 (*) >90 mL/min    GFR calc Af Amer 53 (*) >90 mL/min   POCT I-STAT TROPONIN I     Status: Normal   Collection Time   11/30/11  7:22 PM      Component Value Range Comment   Troponin i, poc 0.02  0.00 - 0.08 ng/mL    Comment 3              Dg Chest 1 View  11/30/2011  *RADIOLOGY REPORT*  Clinical Data: Fall.  CHEST - 1 VIEW  Comparison: October 09, 2011.  Findings: Cardiomediastinal silhouette appears normal.  No acute pulmonary disease is noted.  Surgical clips are noted in left axillary region.  IMPRESSION: No acute cardiopulmonary abnormality seen.  Original Report Authenticated By: Venita Sheffield., M.D.   Dg Hip Complete Left  11/30/2011  *RADIOLOGY REPORT*  Clinical Data: fall, deformity.  LEFT HIP - COMPLETE 2+ VIEW  Comparison: None.  Findings: There is a left femoral intertrochanteric fracture with mild varus angulation.  No subluxation or dislocation.  Hardware noted within the right hip from remote injury.  IMPRESSION: Left intertrochanteric femoral fracture.  Original Report Authenticated By: Cyndie Chime, M.D.    ROS: as pertinent to HPI denies other injury  Physical Exam: Thin female alert and oriented. Left lower extremity postioned on pillows for comfort but NVI   Xrays show Left intertrochanteric hip fracture. Has had previous ORIF of R hip   Vitals Temp:  [98 F (36.7 C)] 98 F (36.7 C) (08/17 1840) Pulse Rate:  [80-114] 80  (08/17 1941) Resp:  [12-22] 12  (08/17 1941) BP: (159-200)/(70-87) 159/87 mmHg (08/17 1941) SpO2:  [99 %-100 %] 99 % (08/17 1941)  Assessment/Plan: Impression: Left intertrochanteric hip fracture Treatment:  Admit per medicine. Plan for ORIF left hip in AM  [facility] for Dr. Francena Hanly 11/30/2011, 9:53 PM

## 2011-11-30 NOTE — ED Notes (Signed)
Per EMS-pt c/o of fall to left hip while at the mall shopping. Denies dizziness and LOC.

## 2011-11-30 NOTE — ED Provider Notes (Signed)
History     CSN: [id number]  Arrival date & time 11/30/11  1758   First MD Initiated Contact with Patient 11/30/11 1806      Chief Complaint  Patient presents with  . Fall    (Consider location/radiation/quality/duration/timing/severity/associated sxs/prior treatment) The history is provided by the patient and medical records.   Kristen Rodriguez is a 78 y.o. female presents to the emergency department complaining of L hip pain after a fall.  The onset of the symptoms was  abrupt starting 2 hours ago.  The patient has associated inability to use the leg.  The symptoms have been  persistent, stabilized.  movement makes the symptoms worse and rest/no movement makes symptoms better.  The patient denies fever, chills, headache, syncope, chest pain, shortness of breath, dysuria, abdomina pain, back pain, neck pain.  Pt states she was walking to her car in the rain and felt as if she was falling forward as she walked.  She grabbed a nearby car, but was unable to stop her fall.  She states she slid down the side of the car and hit her L hip on the concrete.  She denies loss of consciousness or hitting her head. She is unsure why she fell.  She states that afterward she was unable to pick up her L leg and she had significant pain.    The patient has medical history significant for:  Past Medical History  Diagnosis Date  . Precordial pain   . Old myocardial infarction   . Hypopotassemia   . Esophageal reflux   . Allergic rhinitis, cause unspecified   . Pure hypercholesterolemia   . Unspecified essential hypertension   . Family history of ischemic heart disease   . CAD (coronary artery disease) 07/2000    Status Post NSTEMI, 60% mid RCA lesion treated medically  . Breast cancer 2006  . DVT (deep venous thrombosis) 2006  . Macular degeneration   . CHF (congestive heart failure)   . H/O hiatal hernia      Past Medical History  Diagnosis Date  . Precordial pain   . Old myocardial infarction     . Hypopotassemia   . Esophageal reflux   . Allergic rhinitis, cause unspecified   . Pure hypercholesterolemia   . Unspecified essential hypertension   . Family history of ischemic heart disease   . CAD (coronary artery disease) 07/2000    Status Post NSTEMI, 60% mid RCA lesion treated medically  . Breast cancer 2006  . DVT (deep venous thrombosis) 2006  . Macular degeneration   . CHF (congestive heart failure)   . H/O hiatal hernia     Past Surgical History  Procedure Date  . Lumbar laminectomy   . Cataract extraction, bilateral   . Abdominal hysterectomy   . Corneal implant     left  . Cardiac catheterization 2002    Without PCI    Family History  Problem Relation Age of Onset  . Heart attack Father 37    Fatal  . Cancer      History  Substance Use Topics  . Smoking status: Never Smoker   . Smokeless tobacco: Never Used  . Alcohol Use: No    OB History    Grav Para Term Preterm Abortions TAB SAB Ect Mult Living                  Review of Systems  Constitutional: Negative for fever, diaphoresis, appetite change, fatigue and unexpected weight change.  HENT: Negative for mouth sores.   Eyes: Negative for visual disturbance.  Respiratory: Negative for cough, chest tightness, shortness of breath and wheezing.   Cardiovascular: Negative for chest pain.  Gastrointestinal: Negative for nausea, vomiting, abdominal pain, diarrhea and constipation.  Genitourinary: Negative for dysuria, urgency, frequency and hematuria.  Musculoskeletal: Positive for joint swelling (L hip) and gait problem. Negative for back pain.  Skin: Negative for rash.  Neurological: Negative for syncope, light-headedness and headaches.  Hematological: Does not bruise/bleed easily.  Psychiatric/Behavioral: Negative for disturbed wake/sleep cycle. The patient is not nervous/anxious.     Allergies  Codeine; Penicillins; and Sulfa antibiotics  Home Medications   Current Outpatient Rx  Name  Route Sig Dispense Refill  . ASPIRIN 81 MG PO CHEW Oral Chew 1 tablet (81 mg total) by mouth daily.    . ATORVASTATIN CALCIUM 80 MG PO TABS Oral Take 1 tablet (80 mg total) by mouth daily at 6 PM. 30 tablet 6  . CLOPIDOGREL BISULFATE 75 MG PO TABS Oral Take 1 tablet (75 mg total) by mouth daily with breakfast. 30 tablet 10  . DIPHENHYDRAMINE HCL 25 MG PO CAPS Oral Take 1 capsule (25 mg total) by mouth at bedtime as needed for sleep. 30 capsule 6  . DIPHENHYDRAMINE HCL 25 MG PO TABS Oral Take 25 mg by mouth at bedtime as needed.    Marland Kitchen HYDROCODONE-ACETAMINOPHEN 5-500 MG PO TABS Oral Take 1 tablet by mouth every 6 (six) hours as needed. For pain.    Marland Kitchen METOPROLOL TARTRATE 25 MG PO TABS Oral Take 1 tablet (25 mg total) by mouth 2 (two) times daily. 60 tablet 10  . MULTIVITAMIN PO Oral Take 1 tablet by mouth daily.      Marland Kitchen NITROGLYCERIN 0.4 MG SL SUBL Sublingual Place 0.4 mg under the tongue every 5 (five) minutes as needed.    Marland Kitchen PANTOPRAZOLE SODIUM 40 MG PO TBEC Oral Take 1 tablet (40 mg total) by mouth daily at 6 (six) AM. 30 tablet 11  . POLYETHYLENE GLYCOL 3350 PO PACK Oral Take 17 g by mouth daily.    . RESTORA PO CAPS Oral Take 1 capsule by mouth daily.      BP 159/87  Pulse 80  Temp 98 F (36.7 C) (Oral)  Resp 12  SpO2 99%  Physical Exam  Nursing note and vitals reviewed. Constitutional: She is oriented to person, place, and time. She appears well-developed and well-nourished. No distress.  HENT:  Head: Normocephalic and atraumatic.  Mouth/Throat: Oropharynx is clear and moist. No oropharyngeal exudate.  Eyes: Conjunctivae are normal. No scleral icterus.  Neck: Normal range of motion. Neck supple.  Cardiovascular: Normal rate, regular rhythm, normal heart sounds and intact distal pulses.  Exam reveals no gallop and no friction rub.   No murmur heard.      Capillary refill <3 sec Intact distal pulses  Pulmonary/Chest: Effort normal and breath sounds normal. No respiratory distress.  She has no wheezes.  Abdominal: Soft. Bowel sounds are normal. She exhibits no mass. There is no tenderness. There is no rebound and no guarding.  Musculoskeletal: She exhibits tenderness (L hip).       Unable to move L hip FROM of ankle and toes  Neurological: She is alert and oriented to person, place, and time. She exhibits normal muscle tone. Coordination normal. GCS eye subscore is 4. GCS verbal subscore is 5. GCS motor subscore is 6.       Speech is clear and goal oriented Moves  extremities without ataxia Sensation intact Strength in ankle intact  Skin: Skin is warm and dry. No rash noted. She is not diaphoretic.  Psychiatric: She has a normal mood and affect.    ED Course  Procedures (including critical care time)  Labs Reviewed  CBC WITH DIFFERENTIAL - Abnormal; Notable for the following:    WBC 11.8 (*)     RBC 3.49 (*)     Hemoglobin 10.7 (*)     HCT 32.7 (*)     Neutro Abs 8.6 (*)     All other components within normal limits  BASIC METABOLIC PANEL - Abnormal; Notable for the following:    Potassium 3.4 (*)     Glucose, Bld 125 (*)     GFR calc non Af Amer 45 (*)     GFR calc Af Amer 53 (*)     All other components within normal limits  URINALYSIS, ROUTINE W REFLEX MICROSCOPIC - Abnormal; Notable for the following:    APPearance TURBID (*)     Hgb urine dipstick LARGE (*)     Protein, ur 100 (*)     Leukocytes, UA LARGE (*)     All other components within normal limits  URINE MICROSCOPIC-ADD ON - Abnormal; Notable for the following:    Squamous Epithelial / LPF MANY (*)     Bacteria, UA MANY (*)     All other components within normal limits  POCT I-STAT TROPONIN I   Dg Chest 1 View  11/30/2011  *RADIOLOGY REPORT*  Clinical Data: Fall.  CHEST - 1 VIEW  Comparison: October 09, 2011.  Findings: Cardiomediastinal silhouette appears normal.  No acute pulmonary disease is noted.  Surgical clips are noted in left axillary region.  IMPRESSION: No acute cardiopulmonary  abnormality seen.  Original Report Authenticated By: Venita Sheffield., M.D.   Dg Hip Complete Left  11/30/2011  *RADIOLOGY REPORT*  Clinical Data: fall, deformity.  LEFT HIP - COMPLETE 2+ VIEW  Comparison: None.  Findings: There is a left femoral intertrochanteric fracture with mild varus angulation.  No subluxation or dislocation.  Hardware noted within the right hip from remote injury.  IMPRESSION: Left intertrochanteric femoral fracture.  Original Report Authenticated By: Cyndie Chime, M.D.    Results for orders placed during the hospital encounter of 11/30/11  CBC WITH DIFFERENTIAL      Component Value Range   WBC 11.8 (*) 4.0 - 10.5 K/uL   RBC 3.49 (*) 3.87 - 5.11 MIL/uL   Hemoglobin 10.7 (*) 12.0 - 15.0 g/dL   HCT 16.1 (*) 09.6 - 04.5 %   MCV 93.7  78.0 - 100.0 fL   MCH 30.7  26.0 - 34.0 pg   MCHC 32.7  30.0 - 36.0 g/dL   RDW 40.9  81.1 - 91.4 %   Platelets 242  150 - 400 K/uL   Neutrophils Relative 73  43 - 77 %   Neutro Abs 8.6 (*) 1.7 - 7.7 K/uL   Lymphocytes Relative 15  12 - 46 %   Lymphs Abs 1.7  0.7 - 4.0 K/uL   Monocytes Relative 9  3 - 12 %   Monocytes Absolute 1.0  0.1 - 1.0 K/uL   Eosinophils Relative 4  0 - 5 %   Eosinophils Absolute 0.4  0.0 - 0.7 K/uL   Basophils Relative 0  0 - 1 %   Basophils Absolute 0.1  0.0 - 0.1 K/uL  BASIC METABOLIC PANEL  Component Value Range   Sodium 137  135 - 145 mEq/L   Potassium 3.4 (*) 3.5 - 5.1 mEq/L   Chloride 102  96 - 112 mEq/L   CO2 26  19 - 32 mEq/L   Glucose, Bld 125 (*) 70 - 99 mg/dL   BUN 16  6 - 23 mg/dL   Creatinine, Ser 7.82  0.50 - 1.10 mg/dL   Calcium 9.1  8.4 - 95.6 mg/dL   GFR calc non Af Amer 45 (*) >90 mL/min   GFR calc Af Amer 53 (*) >90 mL/min  URINALYSIS, ROUTINE W REFLEX MICROSCOPIC      Component Value Range   Color, Urine YELLOW  YELLOW   APPearance TURBID (*) CLEAR   Specific Gravity, Urine 1.016  1.005 - 1.030   pH 7.5  5.0 - 8.0   Glucose, UA NEGATIVE  NEGATIVE mg/dL   Hgb urine  dipstick LARGE (*) NEGATIVE   Bilirubin Urine NEGATIVE  NEGATIVE   Ketones, ur NEGATIVE  NEGATIVE mg/dL   Protein, ur 213 (*) NEGATIVE mg/dL   Urobilinogen, UA 0.2  0.0 - 1.0 mg/dL   Nitrite NEGATIVE  NEGATIVE   Leukocytes, UA LARGE (*) NEGATIVE  URINE MICROSCOPIC-ADD ON      Component Value Range   Squamous Epithelial / LPF MANY (*) RARE   WBC, UA TOO NUMEROUS TO COUNT  <3 WBC/hpf   RBC / HPF 11-20  <3 RBC/hpf   Bacteria, UA MANY (*) RARE  POCT I-STAT TROPONIN I      Component Value Range   Troponin i, poc 0.02  0.00 - 0.08 ng/mL   Comment 3            Dg Chest 1 View  11/30/2011  *RADIOLOGY REPORT*  Clinical Data: Fall.  CHEST - 1 VIEW  Comparison: October 09, 2011.  Findings: Cardiomediastinal silhouette appears normal.  No acute pulmonary disease is noted.  Surgical clips are noted in left axillary region.  IMPRESSION: No acute cardiopulmonary abnormality seen.  Original Report Authenticated By: Venita Sheffield., M.D.   Dg Hip Complete Left  11/30/2011  *RADIOLOGY REPORT*  Clinical Data: fall, deformity.  LEFT HIP - COMPLETE 2+ VIEW  Comparison: None.  Findings: There is a left femoral intertrochanteric fracture with mild varus angulation.  No subluxation or dislocation.  Hardware noted within the right hip from remote injury.  IMPRESSION: Left intertrochanteric femoral fracture.  Original Report Authenticated By: Cyndie Chime, M.D.   ECG  Date: 11/30/2011  Rate: 95  Rhythm: ventircular bigeminy  QRS Axis: normal  Intervals: normal  ST/T Wave abnormalities: normal  Conduction Disutrbances:ventricular bigeminy  Narrative Interpretation: Stable ventricular bigeminy   Old EKG Reviewed: sinus with frequent PVCs    1. UTI (lower urinary tract infection)   2. Closed left hip fracture       MDM  Charm Rings Meetze presents with left hip pain after a fall. She denies loss of consciousness or hitting her head. She is alert and oriented and neurovascularly intact.  She denies neck or  back pain.  Urinalysis indicates a urinary tract infection. CBC with anemia and hemoglobin of 10.7. X-ray of the left hip shows a left femoral intertrochanteric fracture with mild varus angulation. I have discussed this patient with Dr. Rhunette Croft and we will consult orthopedics and proceed with hospital admission.  Patient is resting comfortably and is not in pain at this time.  Dr Rhunette Croft will follow.  Dahlia Client Huberta Tompkins, PA-C 11/30/11 2016  Dierdre Forth, PA-C 11/30/11 2020

## 2011-11-30 NOTE — ED Notes (Signed)
FAO:[id number]<MV> Expected date:11/30/11<BR> Expected time: 5:42 PM<BR> Means of arrival:Ambulance<BR> Comments:<BR> EMS

## 2011-12-01 ENCOUNTER — Other Ambulatory Visit (HOSPITAL_COMMUNITY): Payer: 59

## 2011-12-01 ENCOUNTER — Inpatient Hospital Stay (HOSPITAL_COMMUNITY): Payer: Medicare Other | Admitting: Anesthesiology

## 2011-12-01 ENCOUNTER — Inpatient Hospital Stay (HOSPITAL_COMMUNITY): Payer: Medicare Other

## 2011-12-01 ENCOUNTER — Encounter (HOSPITAL_COMMUNITY): Payer: Self-pay | Admitting: Anesthesiology

## 2011-12-01 ENCOUNTER — Encounter (HOSPITAL_COMMUNITY)
Admission: EM | Disposition: A | Discharge status: Skilled Nursing Facility | Payer: Self-pay | Source: Home / Self Care | Attending: Internal Medicine

## 2011-12-01 DIAGNOSIS — E785 Hyperlipidemia, unspecified: Secondary | ICD-10-CM

## 2011-12-01 DIAGNOSIS — R0989 Other specified symptoms and signs involving the circulatory and respiratory systems: Secondary | ICD-10-CM

## 2011-12-01 HISTORY — PX: FEMUR IM NAIL: SHX1597

## 2011-12-01 LAB — DIFFERENTIAL
Basophils Absolute: 0.1 10*3/uL (ref 0.0–0.1)
Basophils Relative: 1 % (ref 0–1)
Monocytes Relative: 10 % (ref 3–12)
Neutro Abs: 7.3 10*3/uL (ref 1.7–7.7)
Neutrophils Relative %: 68 % (ref 43–77)

## 2011-12-01 LAB — IRON AND TIBC
Iron: 38 ug/dL — ABNORMAL LOW (ref 42–135)
TIBC: 337 ug/dL (ref 250–470)
UIBC: 299 ug/dL (ref 125–400)

## 2011-12-01 LAB — COMPREHENSIVE METABOLIC PANEL
ALT: 63 U/L — ABNORMAL HIGH (ref 0–35)
CO2: 23 mEq/L (ref 19–32)
Calcium: 8.8 mg/dL (ref 8.4–10.5)
GFR calc Af Amer: 58 mL/min — ABNORMAL LOW (ref >90)
GFR calc non Af Amer: 50 mL/min — ABNORMAL LOW (ref >90)
Glucose, Bld: 121 mg/dL — ABNORMAL HIGH (ref 70–99)
Sodium: 138 mEq/L (ref 135–145)
Total Bilirubin: 0.6 mg/dL (ref 0.3–1.2)

## 2011-12-01 LAB — TSH: TSH: 0.738 u[IU]/mL (ref 0.350–4.500)

## 2011-12-01 LAB — CBC
Hemoglobin: 9.5 g/dL — ABNORMAL LOW (ref 12.0–15.0)
MCHC: 33.2 g/dL (ref 30.0–36.0)
Platelets: 217 10*3/uL (ref 150–400)
RDW: 13.9 % (ref 11.5–15.5)

## 2011-12-01 LAB — ABO/RH: ABO/RH(D): A POS

## 2011-12-01 LAB — SURGICAL PCR SCREEN
MRSA, PCR: NEGATIVE
Staphylococcus aureus: NEGATIVE

## 2011-12-01 LAB — VITAMIN B12: Vitamin B-12: 582 pg/mL (ref 211–911)

## 2011-12-01 LAB — HEMOGLOBIN A1C: Hgb A1c MFr Bld: 6 % — ABNORMAL HIGH (ref <5.7)

## 2011-12-01 SURGERY — INSERTION, INTRAMEDULLARY ROD, FEMUR
Anesthesia: General | Site: Hip | Laterality: Left | Wound class: Clean

## 2011-12-01 MED ORDER — ETOMIDATE 2 MG/ML IV SOLN
INTRAVENOUS | Status: DC | PRN
  Administered 2011-12-01: 14 mg via INTRAVENOUS

## 2011-12-01 MED ORDER — DIPHENHYDRAMINE HCL 25 MG PO CAPS
25.0000 mg | ORAL_CAPSULE | Freq: Every day | ORAL | Status: DC
  Administered 2011-12-01 – 2011-12-03 (×4): 25 mg via ORAL
  Filled 2011-12-01 (×8): qty 1

## 2011-12-01 MED ORDER — PANTOPRAZOLE SODIUM 40 MG PO TBEC
40.0000 mg | DELAYED_RELEASE_TABLET | Freq: Every day | ORAL | Status: DC
  Administered 2011-12-02 – 2011-12-04 (×3): 40 mg via ORAL
  Filled 2011-12-01 (×4): qty 1

## 2011-12-01 MED ORDER — ENOXAPARIN SODIUM 30 MG/0.3ML ~~LOC~~ SOLN
30.0000 mg | Freq: Two times a day (BID) | SUBCUTANEOUS | Status: DC
  Administered 2011-12-02 – 2011-12-04 (×5): 30 mg via SUBCUTANEOUS
  Filled 2011-12-01 (×7): qty 0.3

## 2011-12-01 MED ORDER — RESTORA PO CAPS: 1.0000 | ORAL_CAPSULE | Freq: Every day | ORAL | Status: DC

## 2011-12-01 MED ORDER — PROMETHAZINE HCL 25 MG/ML IJ SOLN
6.2500 mg | INTRAMUSCULAR | Status: DC | PRN
  Administered 2011-12-01: 6.25 mg via INTRAVENOUS

## 2011-12-01 MED ORDER — METHOCARBAMOL 500 MG PO TABS
500.0000 mg | ORAL_TABLET | Freq: Four times a day (QID) | ORAL | Status: DC | PRN
  Administered 2011-12-02 – 2011-12-03 (×2): 500 mg via ORAL
  Filled 2011-12-01 (×2): qty 1

## 2011-12-01 MED ORDER — SODIUM CHLORIDE 0.9 % IV SOLN
INTRAVENOUS | Status: AC
  Administered 2011-12-01: 01:00:00 via INTRAVENOUS

## 2011-12-01 MED ORDER — RISAQUAD PO CAPS
1.0000 | ORAL_CAPSULE | Freq: Every day | ORAL | Status: DC
  Administered 2011-12-02 – 2011-12-04 (×3): 1 via ORAL
  Filled 2011-12-01 (×4): qty 1

## 2011-12-01 MED ORDER — LEVOFLOXACIN IN D5W 500 MG/100ML IV SOLN
500.0000 mg | INTRAVENOUS | Status: DC
  Administered 2011-12-01: 500 mg via INTRAVENOUS
  Filled 2011-12-01 (×2): qty 100

## 2011-12-01 MED ORDER — CEFAZOLIN SODIUM-DEXTROSE 2-3 GM-% IV SOLR
2.0000 g | INTRAVENOUS | Status: AC
  Administered 2011-12-01: 2 g via INTRAVENOUS
  Filled 2011-12-01: qty 50

## 2011-12-01 MED ORDER — DEXTROSE 5 % IV SOLN
500.0000 mg | Freq: Four times a day (QID) | INTRAVENOUS | Status: DC | PRN
  Filled 2011-12-01: qty 5

## 2011-12-01 MED ORDER — CLOPIDOGREL BISULFATE 75 MG PO TABS
75.0000 mg | ORAL_TABLET | Freq: Every day | ORAL | Status: DC
  Filled 2011-12-01 (×2): qty 1

## 2011-12-01 MED ORDER — ONDANSETRON HCL 4 MG/2ML IJ SOLN
INTRAMUSCULAR | Status: DC | PRN
  Administered 2011-12-01: 4 mg via INTRAVENOUS

## 2011-12-01 MED ORDER — ACETAMINOPHEN 10 MG/ML IV SOLN
INTRAVENOUS | Status: AC
  Filled 2011-12-01: qty 100

## 2011-12-01 MED ORDER — OXYCODONE HCL 5 MG PO TABS
5.0000 mg | ORAL_TABLET | ORAL | Status: DC | PRN
  Administered 2011-12-02: 5 mg via ORAL
  Administered 2011-12-02: 10 mg via ORAL
  Administered 2011-12-03 – 2011-12-04 (×3): 5 mg via ORAL
  Filled 2011-12-01: qty 1
  Filled 2011-12-01: qty 2
  Filled 2011-12-01 (×3): qty 1

## 2011-12-01 MED ORDER — SODIUM CHLORIDE 0.9 % IV SOLN: INTRAVENOUS | Status: DC

## 2011-12-01 MED ORDER — ATORVASTATIN CALCIUM 80 MG PO TABS
80.0000 mg | ORAL_TABLET | Freq: Every day | ORAL | Status: DC
  Administered 2011-12-02 – 2011-12-03 (×2): 80 mg via ORAL
  Filled 2011-12-01 (×4): qty 1

## 2011-12-01 MED ORDER — LORAZEPAM 0.5 MG PO TABS
0.5000 mg | ORAL_TABLET | Freq: Every day | ORAL | Status: DC
  Administered 2011-12-01 – 2011-12-03 (×4): 0.5 mg via ORAL
  Filled 2011-12-01 (×4): qty 1

## 2011-12-01 MED ORDER — DEXAMETHASONE SODIUM PHOSPHATE 10 MG/ML IJ SOLN
INTRAMUSCULAR | Status: DC | PRN
  Administered 2011-12-01: 5 mg via INTRAVENOUS

## 2011-12-01 MED ORDER — ONDANSETRON HCL 4 MG PO TABS: 4.0000 mg | ORAL_TABLET | Freq: Four times a day (QID) | ORAL | Status: DC | PRN

## 2011-12-01 MED ORDER — SODIUM CHLORIDE 0.9 % IV SOLN
INTRAVENOUS | Status: DC
  Administered 2011-12-01: 20 mL/h via INTRAVENOUS
  Administered 2011-12-03: 04:00:00 via INTRAVENOUS

## 2011-12-01 MED ORDER — DOCUSATE SODIUM 100 MG PO CAPS
100.0000 mg | ORAL_CAPSULE | Freq: Two times a day (BID) | ORAL | Status: DC
  Administered 2011-12-01 – 2011-12-04 (×6): 100 mg via ORAL
  Filled 2011-12-01 (×7): qty 1

## 2011-12-01 MED ORDER — FENTANYL CITRATE 0.05 MG/ML IJ SOLN
INTRAMUSCULAR | Status: AC
  Filled 2011-12-01: qty 2

## 2011-12-01 MED ORDER — PROMETHAZINE HCL 25 MG/ML IJ SOLN
INTRAMUSCULAR | Status: AC
  Filled 2011-12-01: qty 1

## 2011-12-01 MED ORDER — METOPROLOL TARTRATE 25 MG PO TABS
25.0000 mg | ORAL_TABLET | Freq: Two times a day (BID) | ORAL | Status: DC
  Administered 2011-12-01 – 2011-12-04 (×7): 25 mg via ORAL
  Filled 2011-12-01 (×10): qty 1

## 2011-12-01 MED ORDER — LACTATED RINGERS IV SOLN
INTRAVENOUS | Status: DC | PRN
  Administered 2011-12-01: 09:00:00 via INTRAVENOUS

## 2011-12-01 MED ORDER — SODIUM CHLORIDE 0.9 % IV SOLN
INTRAVENOUS | Status: DC | PRN
  Administered 2011-12-01: 10:00:00 via INTRAVENOUS

## 2011-12-01 MED ORDER — HYDROMORPHONE HCL PF 1 MG/ML IJ SOLN: 1.0000 mg | INTRAMUSCULAR | Status: AC | PRN

## 2011-12-01 MED ORDER — FUROSEMIDE 10 MG/ML IJ SOLN
20.0000 mg | Freq: Once | INTRAMUSCULAR | Status: AC
  Administered 2011-12-01: 20 mg via INTRAVENOUS
  Filled 2011-12-01: qty 2

## 2011-12-01 MED ORDER — METOCLOPRAMIDE HCL 10 MG PO TABS: 5.0000 mg | ORAL_TABLET | Freq: Three times a day (TID) | ORAL | Status: DC | PRN

## 2011-12-01 MED ORDER — ASPIRIN 81 MG PO CHEW
81.0000 mg | CHEWABLE_TABLET | Freq: Every day | ORAL | Status: DC
  Filled 2011-12-01: qty 1

## 2011-12-01 MED ORDER — CEFAZOLIN SODIUM-DEXTROSE 2-3 GM-% IV SOLR
INTRAVENOUS | Status: AC
  Filled 2011-12-01: qty 50

## 2011-12-01 MED ORDER — FENTANYL CITRATE 0.05 MG/ML IJ SOLN
INTRAMUSCULAR | Status: DC | PRN
  Administered 2011-12-01 (×3): 50 ug via INTRAVENOUS
  Administered 2011-12-01 (×2): 25 ug via INTRAVENOUS

## 2011-12-01 MED ORDER — MORPHINE SULFATE 4 MG/ML IJ SOLN: 4.0000 mg | INTRAMUSCULAR | Status: DC | PRN

## 2011-12-01 MED ORDER — FENTANYL CITRATE 0.05 MG/ML IJ SOLN
25.0000 ug | INTRAMUSCULAR | Status: DC | PRN
  Administered 2011-12-01 (×2): 25 ug via INTRAVENOUS

## 2011-12-01 MED ORDER — MORPHINE SULFATE 2 MG/ML IJ SOLN
0.5000 mg | INTRAMUSCULAR | Status: DC | PRN
  Administered 2011-12-01: 0.5 mg via INTRAVENOUS
  Filled 2011-12-01: qty 1

## 2011-12-01 MED ORDER — CEFAZOLIN SODIUM-DEXTROSE 2-3 GM-% IV SOLR
2.0000 g | Freq: Three times a day (TID) | INTRAVENOUS | Status: AC
  Administered 2011-12-01 – 2011-12-02 (×3): 2 g via INTRAVENOUS
  Filled 2011-12-01 (×3): qty 50

## 2011-12-01 MED ORDER — ACETAMINOPHEN 10 MG/ML IV SOLN
INTRAVENOUS | Status: DC | PRN
  Administered 2011-12-01: 500 mg via INTRAVENOUS

## 2011-12-01 MED ORDER — ENOXAPARIN SODIUM 30 MG/0.3ML ~~LOC~~ SOLN
30.0000 mg | SUBCUTANEOUS | Status: DC
  Filled 2011-12-01: qty 0.3

## 2011-12-01 MED ORDER — ONDANSETRON HCL 4 MG/2ML IJ SOLN
INTRAMUSCULAR | Status: AC
  Administered 2011-12-01: 4 mg
  Filled 2011-12-01: qty 2

## 2011-12-01 MED ORDER — LIDOCAINE HCL (CARDIAC) 20 MG/ML IV SOLN
INTRAVENOUS | Status: DC | PRN
  Administered 2011-12-01: 100 mg via INTRAVENOUS

## 2011-12-01 MED ORDER — HYDROCODONE-ACETAMINOPHEN 5-325 MG PO TABS: 1.0000 | ORAL_TABLET | Freq: Four times a day (QID) | ORAL | Status: DC | PRN

## 2011-12-01 MED ORDER — METOCLOPRAMIDE HCL 5 MG/ML IJ SOLN
5.0000 mg | Freq: Three times a day (TID) | INTRAMUSCULAR | Status: DC | PRN
  Administered 2011-12-01: 5 mg via INTRAVENOUS
  Administered 2011-12-03: 10 mg via INTRAVENOUS
  Filled 2011-12-01 (×2): qty 2

## 2011-12-01 MED ORDER — VANCOMYCIN HCL IN DEXTROSE 1-5 GM/200ML-% IV SOLN
1000.0000 mg | Freq: Two times a day (BID) | INTRAVENOUS | Status: AC
  Filled 2011-12-01: qty 200

## 2011-12-01 MED ORDER — POLYETHYLENE GLYCOL 3350 17 G PO PACK
17.0000 g | PACK | Freq: Every day | ORAL | Status: DC
  Administered 2011-12-02 – 2011-12-04 (×3): 17 g via ORAL
  Filled 2011-12-01 (×4): qty 1

## 2011-12-01 MED ORDER — ONDANSETRON HCL 4 MG/2ML IJ SOLN
4.0000 mg | Freq: Four times a day (QID) | INTRAMUSCULAR | Status: DC | PRN
  Administered 2011-12-01 – 2011-12-03 (×2): 4 mg via INTRAVENOUS
  Filled 2011-12-01 (×2): qty 2

## 2011-12-01 MED ORDER — SODIUM CHLORIDE 0.9 % IJ SOLN
3.0000 mL | Freq: Two times a day (BID) | INTRAMUSCULAR | Status: DC
  Administered 2011-12-01 – 2011-12-04 (×4): 3 mL via INTRAVENOUS

## 2011-12-01 MED ORDER — 0.9 % SODIUM CHLORIDE (POUR BTL) OPTIME
TOPICAL | Status: DC | PRN
  Administered 2011-12-01: 1000 mL

## 2011-12-01 SURGICAL SUPPLY — 40 items
BAG SPEC THK2 15X12 ZIP CLS (MISCELLANEOUS) ×1
BAG ZIPLOCK 12X15 (MISCELLANEOUS) ×2 IMPLANT
BANDAGE GAUZE ELAST BULKY 4 IN (GAUZE/BANDAGES/DRESSINGS) ×2 IMPLANT
BIT DRILL CANN LG 4.3MM (BIT) IMPLANT
BNDG COHESIVE 6X5 TAN STRL LF (GAUZE/BANDAGES/DRESSINGS) ×2 IMPLANT
CLOTH BEACON ORANGE TIMEOUT ST (SAFETY) ×2 IMPLANT
DRAPE C-ARM 42X72 X-RAY (DRAPES) ×1 IMPLANT
DRAPE STERI IOBAN 125X83 (DRAPES) ×2 IMPLANT
DRILL BIT CANN LG 4.3MM (BIT) ×2
DRSG MEPILEX BORDER 4X4 (GAUZE/BANDAGES/DRESSINGS) ×1 IMPLANT
DRSG MEPILEX BORDER 4X8 (GAUZE/BANDAGES/DRESSINGS) ×1 IMPLANT
DRSG PAD ABDOMINAL 8X10 ST (GAUZE/BANDAGES/DRESSINGS) ×2 IMPLANT
DURAPREP 26ML APPLICATOR (WOUND CARE) ×2 IMPLANT
ELECT REM PT RETURN 9FT ADLT (ELECTROSURGICAL) ×2
ELECTRODE REM PT RTRN 9FT ADLT (ELECTROSURGICAL) ×1 IMPLANT
GLOVE BIO SURGEON STRL SZ 6.5 (GLOVE) ×2 IMPLANT
GLOVE BIOGEL PI IND STRL 8 (GLOVE) ×1 IMPLANT
GLOVE BIOGEL PI INDICATOR 8 (GLOVE) ×1
GLOVE ECLIPSE 6.5 STRL STRAW (GLOVE) IMPLANT
GLOVE INDICATOR 6.5 STRL GRN (GLOVE) ×2 IMPLANT
GLOVE SURG SS PI 8.0 STRL IVOR (GLOVE) IMPLANT
GOWN PREVENTION PLUS LG XLONG (DISPOSABLE) ×2 IMPLANT
GOWN PREVENTION PLUS XLARGE (GOWN DISPOSABLE) ×2 IMPLANT
GOWN STRL REIN XL XLG (GOWN DISPOSABLE) ×2 IMPLANT
GUIDEPIN 3.2X17.5 THRD DISP (PIN) ×1 IMPLANT
HFN LAG SCREW 10.5MM X 85MM (Screw) ×1 IMPLANT
KIT BASIN OR (CUSTOM PROCEDURE TRAY) ×2 IMPLANT
MANIFOLD NEPTUNE II (INSTRUMENTS) ×2 IMPLANT
NAIL HIP FRACT 130D 11X180 (Screw) ×1 IMPLANT
PACK GENERAL/GYN (CUSTOM PROCEDURE TRAY) ×2 IMPLANT
PAD CAST 4YDX4 CTTN HI CHSV (CAST SUPPLIES) ×1 IMPLANT
PADDING CAST COTTON 4X4 STRL (CAST SUPPLIES) ×2
SCREW BONE CORTICAL 5.0X3 (Screw) ×1 IMPLANT
SPONGE GAUZE 4X4 12PLY (GAUZE/BANDAGES/DRESSINGS) ×2 IMPLANT
STAPLER VISISTAT (STAPLE) ×3 IMPLANT
SUT VIC AB 0 CT1 27 (SUTURE) ×4
SUT VIC AB 0 CT1 27XBRD ANTBC (SUTURE) ×2 IMPLANT
SUT VIC AB 2-0 CT1 27 (SUTURE) ×2
SUT VIC AB 2-0 CT1 TAPERPNT 27 (SUTURE) ×1 IMPLANT
TRAY FOLEY CATH 14FRSI W/METER (CATHETERS) IMPLANT

## 2011-12-01 NOTE — Brief Op Note (Signed)
11/30/2011 - 12/01/2011  11:16 AM  PATIENT:  Kristen Rodriguez  78 y.o. female  PRE-OPERATIVE DIAGNOSIS:  left hip intertrochanteric fracture  POST-OPERATIVE DIAGNOSIS:  left hip intertrochanteric fracture  PROCEDURE:  Procedure(s) (LRB): INTRAMEDULLARY (IM) NAIL FEMORAL (Left)  SURGEON:  Surgeon(s) and Role:    * Javier Docker, MD - Primary  PHYSICIAN ASSISTANT:   ASSISTANTS: [place]   ANESTHESIA:   general  EBL:  Total I/O In: 1250 [I.V.:900; Blood:350] Out: 100 [Blood:100]  BLOOD ADMINISTERED:none  DRAINS: none   LOCAL MEDICATIONS USED:  NONE  SPECIMEN:  No Specimen  DISPOSITION OF SPECIMEN:  N/A  COUNTS:  YES  TOURNIQUET:  * No tourniquets in log *  DICTATION: .Other Dictation: Dictation Number [id number]  PLAN OF CARE: Admit to inpatient   PATIENT DISPOSITION:  PACU - hemodynamically stable.   Delay start of Pharmacological VTE agent (>24hrs) due to surgical blood loss or risk of bleeding: no

## 2011-12-01 NOTE — Anesthesia Postprocedure Evaluation (Signed)
  Anesthesia Post-op Note  Patient: Kristen Rodriguez  Procedure(s) Performed: Procedure(s) (LRB): INTRAMEDULLARY (IM) NAIL FEMORAL (Left)  Patient Location: PACU  Anesthesia Type: General  Level of Consciousness: awake and alert   Airway and Oxygen Therapy: Patient Spontanous Breathing  Post-op Pain: mild  Post-op Assessment: Post-op Vital signs reviewed, Patient's Cardiovascular Status Stable, Respiratory Function Stable, Patent Airway and No signs of Nausea or vomiting  Post-op Vital Signs: stable  Complications: No apparent anesthesia complications

## 2011-12-01 NOTE — Preoperative (Signed)
Beta Blockers   Reason not to administer Beta Blockers:pt given lopressor at 0115 today

## 2011-12-01 NOTE — Progress Notes (Signed)
Orthopedic Tech Progress Note Patient Details:  Kristen Rodriguez [birth year] [id number]  Musculoskeletal Traction Type of Traction: Bucks Skin Traction Traction Weight: 5 lbs    [name] 12/01/2011, 12:34 AM

## 2011-12-01 NOTE — ED Provider Notes (Signed)
Medical screening examination/treatment/procedure(s) were conducted as a shared visit with non-physician practitioner(s) and myself.  I personally evaluated the patient during the encounter.  Pt comes in s/p mechanical fall. Femur fracture noted. Neurovascularly intact. Orthopedic service to see patient, hospitalist to admit. Pain control in the ED.  Derwood Kaplan, MD 12/01/11 [phone]

## 2011-12-01 NOTE — Interval H&P Note (Signed)
History and Physical Interval Note:  12/01/2011 9:42 AM  Kristen Rodriguez  has presented today for surgery, with the diagnosis of left hip intertrochanteric fracture  The various methods of treatment have been discussed with the patient and family. After consideration of risks, benefits and other options for treatment, the patient has consented to  Procedure(s) (LRB): INTRAMEDULLARY (IM) NAIL FEMORAL (Left) as a surgical intervention .  The patient's history has been reviewed, patient examined, no change in status, stable for surgery.  I have reviewed the patient's chart and labs.  Questions were answered to the patient's satisfaction.     Saatvik Thielman C

## 2011-12-01 NOTE — Progress Notes (Signed)
[facility] PROGRESS NOTE  Kristen Rodriguez ION:[id number] DOB: 08-16-1923 DOA: 11/30/2011 PCP: Samuel Jester, DO  Assessment/Plan: 1. L intertrochanteric hip fracture: Appreciate ortho input and assistance in the care. Patient is s/p intramedullary nail placement on her left hip. Will follow further pos-operative rec's. Continue supportive care, PT evaluation and PRN pain meds. Will follow vit D 25-oh level due to presumed osteoporosis 2. Uti: tx with levaquin 500mg  iv qday for a total of 5 days. Urine cx was never taken before antibiotics; will treat empirically. 3. Anemia: preliminary anemia panel results consistent with iron deficiency anemia. Will start ferrous sulfate and will keep Hgb above 9.0 (hx of CAD). Had received 1 unit of PRBC already. 4. Wt loss: pt has CEA elevation and hx of tubular adenoma fragment on colonoscopy 2007, will recommend further evaluation with colonoscopy as outpatient 5. CAD s/p MI, RCA stent: cont asa, plavix. Patient is CP free and so far cardiac enzymes are negative. Will continue b-blocker  6. Hypokalemia: will replete potassium and recheck cmp in am 7. H/o CHF (mixed pattern, EF 45%): will follow close I/O, continue b-blockers, provide lasix after transfusion and be cautious with IVF's.  8. Gerd: cont protonix 9. Anxiety/insomnia: continue ativan QHS.  Code Status: Full Family Communication: Daughter at bedside Disposition Plan: most likely SNF for physical rehab   Brief narrative: 78 yo female with hx of CAD s/p ptca/stent RCA 10/08/2011, ?CHF, R THA, Wt loss (+ elevation in CEA), h/o breast cancer, Presents with c/o mechanical fall leaving [place] going to her car. In ED discovered to have L intertrochanteric hip fracture, and uti. Pt is also noted to have mild anemia.  Consultants:  orthopedics  Procedures:  Left intramedullary nail  Antibiotics:  levaquin  HPI/Subjective: Afebrile, no CP, no abdominal pain, no N/V. Patient reports pain to  be well controlled.  Objective: Filed Vitals:   12/01/11 1130 12/01/11 1200 12/01/11 1230 12/01/11 1233  BP:   175/57 163/48  Pulse:   86   Temp: 98 F (36.7 C) 98 F (36.7 C) 97.3 F (36.3 C) 97.3 F (36.3 C)  TempSrc:      Resp:   15 16  Height:      Weight:      SpO2:   100% 100%    Intake/Output Summary (Last 24 hours) at 12/01/11 1341 Last data filed at 12/01/11 1131  Gross per 24 hour  Intake   1350 ml  Output    525 ml  Net    825 ml   Filed Weights   11/30/11 2338 12/01/11 0500  Weight: 50.4 kg (111 lb 1.8 oz) 53.4 kg (117 lb 11.6 oz)    Exam:   General:  NAD; slightly lethargic from pain meds and anesthesia.  Cardiovascular: S1 and S2; no rubs  Respiratory: good air movement; bibasilar ronchi appreciated  Abdomen: soft; NT, ND, positive BS  Neurologic: sleepy but easily aroused; intact CN; no focal motor deficit  Data Reviewed: Basic Metabolic Panel:  Lab 12/01/11 2440 11/30/11 1859  NA 138 137  K 3.6 3.4*  CL 105 102  CO2 23 26  GLUCOSE 121* 125*  BUN 14 16  CREATININE 1.00 1.08  CALCIUM 8.8 9.1  MG -- --  PHOS -- --   Liver Function Tests:  Lab 12/01/11 0545  AST 82*  ALT 63*  ALKPHOS 648*  BILITOT 0.6  PROT 6.1  ALBUMIN 3.0*   CBC:  Lab 12/01/11 0545 11/30/11 1859  WBC 10.7* 11.8*  NEUTROABS 7.3 8.6*  HGB 9.5* 10.7*  HCT 28.6* 32.7*  MCV 91.7 93.7  PLT 217 242   Cardiac Enzymes:  Lab 12/01/11 0545  CKTOTAL 767*  CKMB 6.5*  CKMBINDEX --  TROPONINI <0.30    Recent Results (from the past 240 hour(s))  SURGICAL PCR SCREEN     Status: Normal   Collection Time   12/01/11 12:37 AM      Component Value Range Status Comment   MRSA, PCR NEGATIVE  NEGATIVE Final    Staphylococcus aureus NEGATIVE  NEGATIVE Final      Studies: Dg Chest 1 View  11/30/2011  *RADIOLOGY REPORT*  Clinical Data: Fall.  CHEST - 1 VIEW  Comparison: October 09, 2011.  Findings: Cardiomediastinal silhouette appears normal.  No acute pulmonary disease  is noted.  Surgical clips are noted in left axillary region.  IMPRESSION: No acute cardiopulmonary abnormality seen.  Original Report Authenticated By: Venita Sheffield., M.D.   Dg Hip Complete Left  11/30/2011  *RADIOLOGY REPORT*  Clinical Data: fall, deformity.  LEFT HIP - COMPLETE 2+ VIEW  Comparison: None.  Findings: There is a left femoral intertrochanteric fracture with mild varus angulation.  No subluxation or dislocation.  Hardware noted within the right hip from remote injury.  IMPRESSION: Left intertrochanteric femoral fracture.  Original Report Authenticated By: Cyndie Chime, M.D.   Dg Femur Left  12/01/2011  *RADIOLOGY REPORT*  Clinical Data: 79 year old female status post left proximal femur ORIF.  LEFT FEMUR - 2 VIEW  Comparison: Preoperative study 11/30/2011.  Fluoroscopy time of 1.5 minutes was utilized.  Findings: Four intraoperative fluoroscopic views of the proximal left femur.  Intramedullary rod is in place with proximal interlocking dynamic hip screw and distal interlocking cortical screw.  Near anatomic alignment at the intertrochanteric fracture.  IMPRESSION: ORIF proximal left femur no adverse features.  Original Report Authenticated By: Harley Hallmark, M.D.   Dg Pelvis Portable  12/01/2011  *RADIOLOGY REPORT*  Clinical Data: Postop ORIF left hip.  PORTABLE PELVIS  Comparison: 12/01/2011  Findings: The patient has undergone placement of intramedullary nail with sliding screw across a left intertrochanteric fracture. Previous ORIF of the right hip also noted.  There is no evidence for dislocation on this frontal view.  Degenerative changes are seen in the lower spine and both hips.  There is dense atherosclerotic calcification of the femoral arteries.  Regional bowel gas pattern is nonobstructive.  IMPRESSION:  1.  Status post ORIF of the left hip.  No adverse features identified on this frontal view. 2.  Previous ORIF right hip.  Original Report Authenticated By: Patterson Hammersmith, M.D.    Scheduled Meds:   . acidophilus  1 capsule Oral Daily  . atorvastatin  80 mg Oral q1800  .  ceFAZolin (ANCEF) IV  2 g Intravenous 60 min Pre-Op  . diphenhydrAMINE  25 mg Oral QHS  . enoxaparin (LOVENOX) injection  30 mg Subcutaneous Q24H  . fentaNYL      . furosemide  20 mg Intravenous Once  . levofloxacin (LEVAQUIN) IV  500 mg Intravenous Q24H  . LORazepam  0.5 mg Oral QHS  . metoprolol tartrate  25 mg Oral BID  . ondansetron      . pantoprazole  40 mg Oral Q1200  . polyethylene glycol  17 g Oral Daily  . promethazine      . sodium chloride  3 mL Intravenous Q12H  . DISCONTD: aspirin  81 mg Oral Daily  . DISCONTD:  clopidogrel  75 mg Oral Q breakfast  . DISCONTD: RESTORA  1 capsule Oral Daily   Continuous Infusions:   . sodium chloride 75 mL/hr at 12/01/11 0111  . sodium chloride    . DISCONTD: sodium chloride      Time spent: > 30 minutes    Kristen Rodriguez  [facility] Pager [phone]. If 8PM-8AM, please contact night-coverage at [email or web address], password [facility] 12/01/2011, 1:41 PM  LOS: 1 day

## 2011-12-01 NOTE — Progress Notes (Signed)
Subjective: Day of Surgery Procedure(s) (LRB): INTRAMEDULLARY (IM) NAIL FEMORAL (Left)   Objective: Vital signs in last 24 hours: Temp:  [97.3 F (36.3 C)-100.4 F (38 C)] 97.8 F (36.6 C) (08/18 1606) Pulse Rate:  [58-114] 58  (08/18 1606) Resp:  [12-22] 16  (08/18 1606) BP: (149-200)/(47-87) 149/76 mmHg (08/18 1606) SpO2:  [91 %-100 %] 98 % (08/18 1606) Weight:  [50.4 kg (111 lb 1.8 oz)-53.4 kg (117 lb 11.6 oz)] 53.4 kg (117 lb 11.6 oz) (08/18 0500)  Intake/Output from previous day: 08/17 0701 - 08/18 0700 In: -  Out: 300 [Urine:300] Intake/Output this shift: Total I/O In: 1350 [I.V.:1000; Blood:350] Out: 225 [Urine:125; Blood:100]   Basename 12/01/11 0545 11/30/11 1859  HGB 9.5* 10.7*    Basename 12/01/11 0545 11/30/11 1859  WBC 10.7* 11.8*  RBC 3.12* 3.49*  HCT 28.6* 32.7*  PLT 217 242    Basename 12/01/11 0545 11/30/11 1859  NA 138 137  K 3.6 3.4*  CL 105 102  CO2 23 26  BUN 14 16  CREATININE 1.00 1.08  GLUCOSE 121* 125*  CALCIUM 8.8 9.1    Basename 11/30/11 2145  LABPT --  INR 0.97    NVI.  Assessment/Plan: Day of Surgery Procedure(s) (LRB): INTRAMEDULLARY (IM) NAIL FEMORAL (Left) PWB left LE. lovenox while in hospital then D/C on plavix and 325mg  ASA for DVT prophylaxis.  Hold plavix for now. Discussed with pharmacy.  Delfina Schreurs C 12/01/2011, 4:56 PM

## 2011-12-01 NOTE — Progress Notes (Signed)
Subjective: Left hip pain   Objective: Vital signs in last 24 hours: Temp:  [97.6 F (36.4 Rodriguez)-100.4 F (38 Rodriguez)] 100.4 F (38 Rodriguez) (08/18 0607) Pulse Rate:  [77-114] 90  (08/18 0607) Resp:  [12-22] 20  (08/18 0607) BP: (156-200)/(52-87) 158/62 mmHg (08/18 0607) SpO2:  [91 %-100 %] 97 % (08/18 0607) Weight:  [50.4 kg (111 lb 1.8 oz)-53.4 kg (117 lb 11.6 oz)] 53.4 kg (117 lb 11.6 oz) (08/18 0500)  Intake/Output from previous day: 08/17 0701 - 08/18 0700 In: -  Out: 300 [Urine:300] Intake/Output this shift:     Basename 12/01/11 0545 11/30/11 1859  HGB 9.5* 10.7*    Basename 12/01/11 0545 11/30/11 1859  WBC 10.7* 11.8*  RBC 3.12* 3.49*  HCT 28.6* 32.7*  PLT 217 242    Basename 12/01/11 0545 11/30/11 1859  NA 138 137  K 3.6 3.4*  CL 105 102  CO2 23 26  BUN 14 16  CREATININE 1.00 1.08  GLUCOSE 121* 125*  CALCIUM 8.8 9.1    Basename 11/30/11 2145  LABPT --  INR 0.97    Neurologically intact Sensation intact distally Intact pulses distally Dorsiflexion/Plantar flexion intact Compartment soft  Assessment/Plan: Left hip fx. Plan IM nail. Risks discussed.   Kristen Rodriguez 12/01/2011, 9:43 AM

## 2011-12-01 NOTE — Anesthesia Preprocedure Evaluation (Addendum)
Anesthesia Evaluation  Patient identified by MRN, date of birth, ID band Patient awake  General Assessment Comment:High risk for periop MI  Telemetry post-op  Reviewed: Allergy & Precautions, H&P , NPO status , Patient's Chart, lab work & pertinent test results  Airway Mallampati: II TM Distance: <3 FB Neck ROM: Limited    Dental No notable dental hx.    Pulmonary neg pulmonary ROS,  breath sounds clear to auscultation  Pulmonary exam normal       Cardiovascular hypertension, Pt. on medications + CAD, + Past MI, + Cardiac Stents and DVT Rhythm:Regular Rate:Normal  MI 6/13, stent x1 MI 2006, medical mgmt   Neuro/Psych negative neurological ROS  negative psych ROS   GI/Hepatic Neg liver ROS, Malnourished    Endo/Other  negative endocrine ROS  Renal/GU negative Renal ROS  negative genitourinary   Musculoskeletal negative musculoskeletal ROS (+)   Abdominal   Peds negative pediatric ROS (+)  Hematology  (+) Blood dyscrasia, anemia ,   Anesthesia Other Findings   Reproductive/Obstetrics negative OB ROS                          Anesthesia Physical Anesthesia Plan  ASA: III  Anesthesia Plan: General   Post-op Pain Management:    Induction: Intravenous  Airway Management Planned: Oral ETT  Additional Equipment:   Intra-op Plan:   Post-operative Plan: Extubation in OR  Informed Consent: I have reviewed the patients History and Physical, chart, labs and discussed the procedure including the risks, benefits and alternatives for the proposed anesthesia with the patient or authorized representative who has indicated his/her understanding and acceptance.   Dental advisory given  Plan Discussed with: CRNA and Surgeon  Anesthesia Plan Comments: (Will transfuse 1 unit prbc's pre-op)       Anesthesia Quick Evaluation

## 2011-12-01 NOTE — Transfer of Care (Signed)
Immediate Anesthesia Transfer of Care Note  Patient: Kristen Rodriguez  Procedure(s) Performed: Procedure(s) (LRB): INTRAMEDULLARY (IM) NAIL FEMORAL (Left)  Patient Location: PACU  Anesthesia Type: General  Level of Consciousness: awake, alert  and oriented  Airway & Oxygen Therapy: Patient Spontanous Breathing and Patient connected to face mask oxygen  Post-op Assessment: Report given to PACU RN and Post -op Vital signs reviewed and stable  Post vital signs: Reviewed and stable  Complications: No apparent anesthesia complications

## 2011-12-02 DIAGNOSIS — I4949 Other premature depolarization: Secondary | ICD-10-CM

## 2011-12-02 LAB — CBC
HCT: 27.2 % — ABNORMAL LOW (ref 36.0–46.0)
Hemoglobin: 9.3 g/dL — ABNORMAL LOW (ref 12.0–15.0)
MCH: 30.6 pg (ref 26.0–34.0)
MCHC: 34.2 g/dL (ref 30.0–36.0)
MCV: 89.5 fL (ref 78.0–100.0)

## 2011-12-02 LAB — VITAMIN D 25 HYDROXY (VIT D DEFICIENCY, FRACTURES): Vit D, 25-Hydroxy: 30 ng/mL (ref 30–89)

## 2011-12-02 LAB — BASIC METABOLIC PANEL
BUN: 16 mg/dL (ref 6–23)
Calcium: 8.1 mg/dL — ABNORMAL LOW (ref 8.4–10.5)
GFR calc non Af Amer: 43 mL/min — ABNORMAL LOW (ref >90)
Glucose, Bld: 113 mg/dL — ABNORMAL HIGH (ref 70–99)

## 2011-12-02 MED ORDER — LEVOFLOXACIN 250 MG PO TABS
250.0000 mg | ORAL_TABLET | ORAL | Status: DC
  Administered 2011-12-02 – 2011-12-03 (×2): 250 mg via ORAL
  Filled 2011-12-02 (×4): qty 1

## 2011-12-02 MED ORDER — POTASSIUM CHLORIDE CRYS ER 20 MEQ PO TBCR
40.0000 meq | EXTENDED_RELEASE_TABLET | ORAL | Status: AC
  Administered 2011-12-02 (×2): 40 meq via ORAL
  Filled 2011-12-02 (×2): qty 2

## 2011-12-02 NOTE — Evaluation (Signed)
Occupational Therapy Evaluation Patient Details Name: Kristen Rodriguez MRN: [id number] DOB: 08/20/1923 Today's Date: 12/02/2011 Time: 4098-1191 OT Time Calculation (min): 31 min  OT Assessment / Plan / Recommendation Clinical Impression  This 78 year old female fell sustaining a L hip fx which has been surgically managed with an IM Nail.  Pt is NWB on LLE now.  She currently needs A x 2 for mobility and LB adls. She is appropriate for skilled OT to increase independence with adls prior to STSNF.      OT Assessment  Patient needs continued OT Services    Follow Up Recommendations  Skilled nursing facility    Barriers to Discharge      Equipment Recommendations  Defer to next venue    Recommendations for Other Services    Frequency  Min 2X/week    Precautions / Restrictions Precautions Precautions: Fall Restrictions Weight Bearing Restrictions: Yes LLE Weight Bearing: Non weight bearing   Pertinent Vitals/Pain No pain in bed, 10 with movement.  RN brought pain meds.  Pt declined ice:  Repositioned in chair    ADL  Eating/Feeding: Simulated;Independent Where Assessed - Eating/Feeding: Chair Grooming: Simulated;Set up Where Assessed - Grooming: Supported sitting Upper Body Bathing: Simulated;Set up Where Assessed - Upper Body Bathing: Supported sitting Lower Body Bathing: Simulated;+2 Total assistance Lower Body Bathing: Patient Percentage: 20% Where Assessed - Lower Body Bathing: Supported sit to stand Upper Body Dressing: Simulated;Set up Where Assessed - Upper Body Dressing: Supported sitting Lower Body Dressing: Simulated;+2 Total assistance Lower Body Dressing: Patient Percentage: 0% Where Assessed - Lower Body Dressing: Supported sit to stand Toilet Transfer: Simulated;+2 Total assistance Toilet Transfer: Patient Percentage: 20% Statistician Method: Surveyor, minerals:  (bed to recliner) Toileting - Architect and Hygiene: Simulated;+2  Total assistance Toileting - Architect and Hygiene: Patient Percentage: 0% Where Assessed - Toileting Clothing Manipulation and Hygiene: Sit to stand from 3-in-1 or toilet Transfers/Ambulation Related to ADLs: during spt, pt initially held bed rail working against OT. Cued to release and hold onto OT's shoulder.  Pt fearful and had pain with movement ADL Comments: did not introduce AE this session    OT Diagnosis: Generalized weakness  OT Problem List: Decreased strength;Decreased activity tolerance;Decreased knowledge of use of DME or AE;Decreased knowledge of precautions;Pain OT Treatment Interventions: Self-care/ADL training;DME and/or AE instruction;Therapeutic activities;Patient/family education   OT Goals Acute Rehab OT Goals OT Goal Formulation: With patient Time For Goal Achievement: 12/09/11 Potential to Achieve Goals: Good ADL Goals Pt Will Transfer to Toilet: with 2+ total assist;Stand pivot transfer;3-in-1;Maintaining weight bearing status (pt 50%) ADL Goal: Toilet Transfer - Progress: Goal set today Miscellaneous OT Goals Miscellaneous OT Goal #1: Pt will perform bed mobility with mod A in prep for 3:1 transfer and ADLs OT Goal: Miscellaneous Goal #1 - Progress: Goal set today Miscellaneous OT Goal #2: Pt will go sit to stand with mod A for adls maintaining NWB on L OT Goal: Miscellaneous Goal #2 - Progress: Goal set today Miscellaneous OT Goal #3: Pt will maintain static standing NWB on L for 2 minutes for ADLs OT Goal: Miscellaneous Goal #3 - Progress: Goal set today Miscellaneous OT Goal #4: Pt will be able to use AE with set up (for R side) OT Goal: Miscellaneous Goal #4 - Progress: Goal set today  Visit Information  Last OT Received On: 12/02/11 PT/OT Co-Evaluation/Treatment: Yes    Subjective Data  Subjective: "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" Patient Stated  Goal: get better   Prior Functioning  Vision/Perception  Home Living Lives With: Alone  (daughter comes 1-2 nights per week) Type of Home: House Home Access: Stairs to enter [employer] of Steps: 1 + 1 to living room; washer/dryer downstairs Bathroom Shower/Tub: Engineer, manufacturing systems: Standard Home Adaptive Equipment: Environmental consultant - rolling;Straight cane Additional Comments: sponge bathes Prior Function Level of Independence: Independent Driving: Yes Communication Communication: No difficulties Dominant Hand: Right      Cognition  Overall Cognitive Status: Appears within functional limits for tasks assessed/performed Arousal/Alertness: Awake/alert Orientation Level: Appears intact for tasks assessed Behavior During Session: [facility] for tasks performed    Extremity/Trunk Assessment Right Upper Extremity Assessment RUE ROM/Strength/Tone: Within functional levels (c/o ligament/RC problem) Left Upper Extremity Assessment LUE ROM/Strength/Tone: Within functional levels   Mobility Bed Mobility Bed Mobility: Supine to Sit Supine to Sit: 1: +2 Total assist Supine to Sit: Patient Percentage: 20% Transfers Transfers: Sit to Stand Sit to Stand: 1: +2 Total assist Sit to Stand: Patient Percentage: 20%   Exercise    Balance    End of Session OT - End of Session Activity Tolerance: Patient limited by pain Patient left: in chair;with call bell/phone within reach (lift pad placed) Nurse Communication: Need for lift equipment;Mobility status  GO     Stanislawa Gaffin 12/02/2011, 10:26 AM Marica Otter, OTR/L [phone] 12/02/2011

## 2011-12-02 NOTE — Progress Notes (Addendum)
Subjective: 1 Day Post-Op Procedure(s) (LRB): INTRAMEDULLARY (IM) NAIL FEMORAL (Left) Patient reports pain as 2 on 0-10 scale.    Objective: Vital signs in last 24 hours: Temp:  [97.6 F (36.4 C)-98.7 F (37.1 C)] 98.6 F (37 C) (08/19 0646) Pulse Rate:  [58-74] 60  (08/19 0646) Resp:  [16-18] 18  (08/19 0646) BP: (124-170)/(67-76) 140/70 mmHg (08/19 0646) SpO2:  [91 %-99 %] 95 % (08/19 0646) Weight:  [53.3 kg (117 lb 8.1 oz)] 53.3 kg (117 lb 8.1 oz) (08/19 0500)  Intake/Output from previous day: 08/18 0701 - 08/19 0700 In: 1900 [I.V.:1350; Blood:350; IV Piggyback:200] Out: 475 [Urine:375; Blood:100] Intake/Output this shift:     Basename 12/02/11 0442 12/01/11 0545 11/30/11 1859  HGB 9.3* 9.5* 10.7*    Basename 12/02/11 0442 12/01/11 0545  WBC 13.1* 10.7*  RBC 3.04* 3.12*  HCT 27.2* 28.6*  PLT 198 217    Basename 12/02/11 0442 12/01/11 0545  NA 137 138  K 3.3* 3.6  CL 101 105  CO2 25 23  BUN 16 14  CREATININE 1.14* 1.00  GLUCOSE 113* 121*  CALCIUM 8.1* 8.8    Basename 11/30/11 2145  LABPT --  INR 0.97    Neurologically intact ABD soft Incision: dressing C/D/I No cellulitis present Compartment soft  Assessment/Plan: 1 Day Post-Op Procedure(s) (LRB): INTRAMEDULLARY (IM) NAIL FEMORAL (Left) Up with therapy Discharge to SNF when able. PWB. lovenox until D/C then plavix and ASA  Corinda Ammon C 12/02/2011, 1:48 PM

## 2011-12-02 NOTE — Progress Notes (Signed)
[facility] PROGRESS NOTE  Kristen Rodriguez:[id number] DOB: Feb 05, 1924 DOA: 11/30/2011 PCP: Samuel Jester, DO  Assessment/Plan: 1. L intertrochanteric hip fracture: Appreciate ortho input and assistance in the care. Patient is s/p intramedullary nail placement on her left hip (post-op day #1). Will follow further pos-operative rec's from ortho group. Continue supportive care and PRN pain meds. Per physical therapy and occupational therapy evaluation and recommendation for rehabilitation he is at a nursing home; social worker has been consulted in order to start placement process. Vitamin D 30 2. Uti: tx with levaquin 500mg  iv qday for a total of 5 days. Urine cx was never taken before antibiotics; will treat empirically. Day 2/5 3. Anemia: preliminary anemia panel results consistent with iron deficiency anemia. Will start ferrous sulfate and will keep Hgb above 9.0 (hx of CAD). Had received 1 unit of PRBC already on 12/01/11. Hgb today 9.3; will monitor 4. Wt loss: pt has CEA elevation and hx of tubular adenoma fragment on colonoscopy 2007, will recommend further evaluation with colonoscopy as outpatient 5. CAD s/p MI, RCA stent: cont asa, plavix. Patient is CP free and cardiac enzymes negative. Will continue b-blocker. Telemetry demonstrated some PVCs but no other acute ischemic changes. Will replete electrolytes and continue monitoring. 6. Hypokalemia: will replete potassium and recheck basic metabolic panel in the morning 7. H/o CHF (mixed pattern, EF 45%): will follow close I/O, continue b-blockers. At this point patient without any signs of fluid overload and her CHF appears compensated.   8. Gerd: cont protonix 9. Anxiety/insomnia: continue ativan QHS.    Code Status: Full Family Communication: Daughter at bedside Disposition Plan: most likely SNF for physical rehab   Brief narrative: 78 yo female with hx of CAD s/p ptca/stent RCA 10/08/2011, ?CHF, R THA, Wt loss (+ elevation in  CEA), h/o breast cancer, Presents with c/o mechanical fall leaving [place] going to her car. In ED discovered to have L intertrochanteric hip fracture, and uti. Pt is also noted to have mild anemia.  Consultants:  orthopedics  Procedures:  Left intramedullary nail  Antibiotics:  levaquin  HPI/Subjective: Afebrile, no CP, no abdominal pain, no N/V. Patient reports some pain in her left hip; otherwise no acute distress. Overnight Nurse reported some PVCs on her telemetry. No other abnormalities.  Objective: Filed Vitals:   12/02/11 0202 12/02/11 0500 12/02/11 0646 12/02/11 1359  BP: 124/67  140/70 148/73  Pulse: 74  60 84  Temp: 98.6 F (37 C)  98.6 F (37 C) 98.8 F (37.1 C)  TempSrc: Oral  Oral   Resp: 16  18 18   Height:      Weight:  53.3 kg (117 lb 8.1 oz)    SpO2: 95%  95% 98%    Intake/Output Summary (Last 24 hours) at 12/02/11 1553 Last data filed at 12/02/11 0600  Gross per 24 hour  Intake    450 ml  Output    250 ml  Net    200 ml   Filed Weights   11/30/11 2338 12/01/11 0500 12/02/11 0500  Weight: 50.4 kg (111 lb 1.8 oz) 53.4 kg (117 lb 11.6 oz) 53.3 kg (117 lb 8.1 oz)    Exam:   General:  NAD  Cardiovascular: S1 and S2; no rubs  Respiratory: good air movement; bibasilar ronchi appreciated  Abdomen: soft; NT, ND, positive BS  Extremities: Left lower strandy this with heat 1 Dressings in Pl., no drainage, no erythema; sore on palpation.  Neurologic: AAOX3; intact CN; no focal motor  deficit  Data Reviewed: Basic Metabolic Panel:  Lab 12/02/11 1610 12/01/11 0545 11/30/11 1859  NA 137 138 137  K 3.3* 3.6 3.4*  CL 101 105 102  CO2 25 23 26   GLUCOSE 113* 121* 125*  BUN 16 14 16   CREATININE 1.14* 1.00 1.08  CALCIUM 8.1* 8.8 9.1  MG -- -- --  PHOS -- -- --   Liver Function Tests:  Lab 12/01/11 0545  AST 82*  ALT 63*  ALKPHOS 648*  BILITOT 0.6  PROT 6.1  ALBUMIN 3.0*   CBC:  Lab 12/02/11 0442 12/01/11 0545 11/30/11 1859  WBC 13.1*  10.7* 11.8*  NEUTROABS -- 7.3 8.6*  HGB 9.3* 9.5* 10.7*  HCT 27.2* 28.6* 32.7*  MCV 89.5 91.7 93.7  PLT 198 217 242   Cardiac Enzymes:  Lab 12/01/11 0545  CKTOTAL 767*  CKMB 6.5*  CKMBINDEX --  TROPONINI <0.30    Recent Results (from the past 240 hour(s))  SURGICAL PCR SCREEN     Status: Normal   Collection Time   12/01/11 12:37 AM      Component Value Range Status Comment   MRSA, PCR NEGATIVE  NEGATIVE Final    Staphylococcus aureus NEGATIVE  NEGATIVE Final      Studies: Dg Chest 1 View  11/30/2011  *RADIOLOGY REPORT*  Clinical Data: Fall.  CHEST - 1 VIEW  Comparison: October 09, 2011.  Findings: Cardiomediastinal silhouette appears normal.  No acute pulmonary disease is noted.  Surgical clips are noted in left axillary region.  IMPRESSION: No acute cardiopulmonary abnormality seen.  Original Report Authenticated By: Venita Sheffield., M.D.   Dg Hip Complete Left  11/30/2011  *RADIOLOGY REPORT*  Clinical Data: fall, deformity.  LEFT HIP - COMPLETE 2+ VIEW  Comparison: None.  Findings: There is a left femoral intertrochanteric fracture with mild varus angulation.  No subluxation or dislocation.  Hardware noted within the right hip from remote injury.  IMPRESSION: Left intertrochanteric femoral fracture.  Original Report Authenticated By: Cyndie Chime, M.D.   Dg Femur Left  12/01/2011  *RADIOLOGY REPORT*  Clinical Data: 78 year old female status post left proximal femur ORIF.  LEFT FEMUR - 2 VIEW  Comparison: Preoperative study 11/30/2011.  Fluoroscopy time of 1.5 minutes was utilized.  Findings: Four intraoperative fluoroscopic views of the proximal left femur.  Intramedullary rod is in place with proximal interlocking dynamic hip screw and distal interlocking cortical screw.  Near anatomic alignment at the intertrochanteric fracture.  IMPRESSION: ORIF proximal left femur no adverse features.  Original Report Authenticated By: Harley Hallmark, M.D.   Dg Pelvis Portable  12/01/2011   *RADIOLOGY REPORT*  Clinical Data: Postop ORIF left hip.  PORTABLE PELVIS  Comparison: 12/01/2011  Findings: The patient has undergone placement of intramedullary nail with sliding screw across a left intertrochanteric fracture. Previous ORIF of the right hip also noted.  There is no evidence for dislocation on this frontal view.  Degenerative changes are seen in the lower spine and both hips.  There is dense atherosclerotic calcification of the femoral arteries.  Regional bowel gas pattern is nonobstructive.  IMPRESSION:  1.  Status post ORIF of the left hip.  No adverse features identified on this frontal view. 2.  Previous ORIF right hip.  Original Report Authenticated By: Patterson Hammersmith, M.D.    Scheduled Meds:    . acidophilus  1 capsule Oral Daily  . atorvastatin  80 mg Oral q1800  .  ceFAZolin (ANCEF) IV  2 g Intravenous  Q8H  . diphenhydrAMINE  25 mg Oral QHS  . docusate sodium  100 mg Oral BID  . enoxaparin (LOVENOX) injection  30 mg Subcutaneous Q12H  . fentaNYL      . levofloxacin  250 mg Oral Q24H  . LORazepam  0.5 mg Oral QHS  . metoprolol tartrate  25 mg Oral BID  . ondansetron      . pantoprazole  40 mg Oral Q1200  . polyethylene glycol  17 g Oral Daily  . potassium chloride  40 mEq Oral Q4H  . promethazine      . sodium chloride  3 mL Intravenous Q12H  . vancomycin  1,000 mg Intravenous Q12H  . DISCONTD: enoxaparin (LOVENOX) injection  30 mg Subcutaneous Q24H  . DISCONTD: levofloxacin (LEVAQUIN) IV  500 mg Intravenous Q24H   Continuous Infusions:    . sodium chloride 20 mL/hr (12/01/11 1418)    Time spent: > 20 minutes    Dewanda Fennema  [facility] Pager [phone]. If 8PM-8AM, please contact night-coverage at [email or web address], password [facility] 12/02/2011, 3:53 PM  LOS: 2 days

## 2011-12-02 NOTE — Progress Notes (Signed)
Pt. Instructed on use of IS, pt able to demonstrate well.  Set goal to , instructed for self-use 10x/hr.

## 2011-12-02 NOTE — Progress Notes (Signed)
Kristen Rodriguez was complaining of discomfort and burning related to the catheter at the beginning of the night shift and requested to have the catheter removed she has since voided without difficulty.

## 2011-12-02 NOTE — Progress Notes (Signed)
PHARMACIST - PHYSICIAN COMMUNICATION DR:   [facility] CONCERNING: Antibiotic IV to Oral Route Change Policy  RECOMMENDATION: This patient is receiving levofloxacin by the intravenous route.  Based on criteria approved by the Pharmacy and Therapeutics Committee, the antibiotic(s) is/are being converted to the equivalent oral dose form(s).   DESCRIPTION: These criteria include:  Patient being treated for a respiratory tract infection, urinary tract infection, or cellulitis  The patient is not neutropenic and does not exhibit a GI malabsorption state  The patient is eating (either orally or via tube) and/or has been taking other orally administered medications for a least 24 hours  The patient is improving clinically and has a Tmax < 100.5  If you have questions about this conversion, please contact the Pharmacy Department  []   [phone] )  [facility] []   [phone] )  [facility]  []   [phone] )  [facility] [x]   [phone] )  [facility]    Levofloxacin dose also being adjusted to 250mg  PO daily for est crCl=64ml/min.  Stop date remains intact to complete 5 days of therapy.  Thanks. Juliette Alcide, PharmD, BCPS.   Pager: [phone] 11:10 AM 12/02/2011

## 2011-12-02 NOTE — Op Note (Signed)
Kristen Rodriguez, Kristen Rodriguez                   ACCOUNT NO.:  [id number]  MEDICAL RECORD NO.:  [id number]  LOCATION:  1506                         FACILITY:  [facility]  PHYSICIAN:  Jene Every, M.D.    DATE OF BIRTH:  05/01/1923  DATE OF PROCEDURE: DATE OF DISCHARGE:                              OPERATIVE REPORT   PREOPERATIVE DIAGNOSIS:  Left intertrochanteric hip fracture.  POSTOPERATIVE DIAGNOSIS:  Left intertrochanteric hip fracture.  PROCEDURE PERFORMED:  Intramedullary nailing of the left proximal femur.  ANESTHESIA:  General.  ASSISTANT:  Thomas B. Dixon, PA-C.  COMPONENTS:  We used a [employer] troch nail, 11 mm diameter short, 85 mm lag screw.  HISTORY:  An 25, fell, fractured hip.  Indicated for internal fixation to intertrochanteric fracture.  Risks and benefits were discussed including bleeding, infection, damage to vascular structures, no change in symptoms, worsening symptoms, DVT, PE, anesthetic complications, malunion, nonunion.  TECHNIQUE:  With the patient in supine position after induction of adequate anesthesia and 1 g Kefzol, she was placed on the fracture table.  All bony prominences were well padded.  Foley to gravity. Perineal post well padded.  Right leg was gently flexed and externally rotated.  Left lower extremity in gentle longitudinal traction, slightly internal rotation.  Next, left hip peritrochanteric region and thigh was prepped and draped in usual sterile fashion.  Under x-ray, there was a satisfactory reduction.  Proximal to the tip of the trochanter, I made a small incision to the skin, subcutaneous tissue was dissected. Electrocautery was utilized to achieve the hemostasis.  Fascia lata identified and divided in line with skin incision.  Identified the tip of the trochanter with the guide pin just slightly medial to the tip, advanced into the femoral canal and AP and lateral plane confirmed. Overreamed it proximally and it was templated with an 11 mm  nail.  We impacted into place.  The person was seated slightly proximally in gentle impaction.  We achieved the midportion of the femoral neck.  Did not want to advance it any further.  We introduced a guide pin up the femoral head and neck.  The neck had measured to 92, selected 85.  In the AP and lateral plane, reamed it to the 85, inserted lag screw with excellent purchase, slightly posterior than the lateral which was preferred, midportion of the head and neck.  She had some prior history of osteoporosis. We just cut out.  She had a fairly stable fracture, did not want to put a longer lag screw in.  Then with the external alignment guide, we made a small incision toward the distal locking screw.  We have made a small incision for the guide pin to the femoral head and neck, which was reamed at 85 and inserted the appropriate length of lag screw.  We then released the traction.  We compressed the fracture site and then locked it proximally.  Distally with the external alignment guide, we made a small incision and blunt dissection down to the femur and drilled under x-ray.  In the AP and lateral plane, we placed a 34 screw ,inserted it with excellent purchase. The AP and lateral plane had  anatomic reduction of the fracture.  We removed the external alignment guide, irrigated the wounds.  No active bleeding.  The fascia was closed with 1 Vicryl in a figure-of-eight sutures, subcu with 2-0 Vicryl simple sutures.  Skin was reapproximated with staples.  Wound was dressed sterilely.  Released from the fracture table.  Leg lengths were equivalent, good pulses, good rotation.  Extubated without difficulty, and transported to the recovery room in satisfactory condition.  The patient tolerated the procedure well.  No complications.  No blood loss.     Jene Every, M.D.     Cordelia Pen  D:  12/01/2011  T:  12/02/2011  Job:  [id number]

## 2011-12-02 NOTE — Evaluation (Addendum)
Physical Therapy Evaluation Patient Details Name: Kristen Rodriguez MRN: [id number] DOB: [birth year] Today's Date: 12/02/2011 Time: 1478-2956 PT Time Calculation (min): 32 min  PT Assessment / Plan / Recommendation Clinical Impression  78 yo female s/p IM nail Left proximal femur. Pt experiencing severe pain with all movement of L LE and attempts at mobiity/OOB. Requires + 2 for all mobility.Recommend ST rehab at SNF to improve activity tolerance and functional mobility.     PT Assessment  Patient needs continued PT services    Follow Up Recommendations  Skilled nursing facility    Barriers to Discharge Decreased caregiver support      Equipment Recommendations  Defer to next venue    Recommendations for Other Services OT consult   Frequency Min 3X/week    Precautions / Restrictions Precautions Precautions: Fall Restrictions Weight Bearing Restrictions: Yes LLE Weight Bearing: Non weight bearing   Pertinent Vitals/Pain       Mobility  Bed Mobility Bed Mobility: Supine to Sit Supine to Sit: 1: +2 Total assist Supine to Sit: Patient Percentage: 20% Details for Bed Mobility Assistance: Multimodal cues for safety, technique, hand placement. assist for trunk to upright and bil LEs off bed. Utilized bed sheet to assist with movement, positioning.  Transfers Transfers: Squat Pivot Transfers Sit to Stand: 1: +2 Total assist Sit to Stand: Patient Percentage: 20% Squat Pivot Transfers: 1: +2 Total assist Squat Pivot Transfers: Patient Percentage: 20% Details for Transfer Assistance: Multimodal cues for safety, technique, hand placement. One therapist for piviot, one therapist to support L LE. Pivot to R side. Pt experiencing too much pain to attempt with RW.  Ambulation/Gait Ambulation/Gait Assistance: Not tested (comment) Ambulation/Gait Assistance Details: Pt experiencing too much pain to attempt safely.     Exercises     PT Diagnosis: Difficulty walking;Abnormality of  gait;Acute pain  PT Problem List: Decreased activity tolerance;Decreased mobility;Decreased balance;Pain;Decreased knowledge of precautions;Decreased knowledge of use of DME;Decreased range of motion PT Treatment Interventions: DME instruction;Functional mobility training;Therapeutic activities;Therapeutic exercise;Balance training;Patient/family education   PT Goals Acute Rehab PT Goals PT Goal Formulation: With patient Time For Goal Achievement: 12/16/11 Potential to Achieve Goals: Good Pt will go Supine/Side to Sit: with mod assist PT Goal: Supine/Side to Sit - Progress: Goal set today Pt will go Sit to Supine/Side: with mod assist PT Goal: Sit to Supine/Side - Progress: Goal set today Pt will go Sit to Stand: with mod assist PT Goal: Sit to Stand - Progress: Goal set today Pt will Transfer Bed to Chair/Chair to Bed: with mod assist PT Transfer Goal: Bed to Chair/Chair to Bed - Progress: Goal set today  Visit Information  Last PT Received On: 12/02/11 Assistance Needed: +2 PT/OT Co-Evaluation/Treatment: Yes    Subjective Data  Subjective: "I don't think I can do this" Patient Stated Goal: Less pain.    Prior Functioning  Home Living Lives With: Alone (daughter comes 1-2 nights per week) Type of Home: House Home Access: Stairs to enter [employer] of Steps: 1 + 1 to living room; washer/dryer downstairs Bathroom Shower/Tub: Engineer, manufacturing systems: Standard Home Adaptive Equipment: Environmental consultant - rolling;Straight cane Additional Comments: sponge bathes Prior Function Level of Independence: Independent Driving: Yes Communication Communication: No difficulties Dominant Hand: Right    Cognition  Overall Cognitive Status: Appears within functional limits for tasks assessed/performed Arousal/Alertness: Awake/alert Orientation Level: Appears intact for tasks assessed Behavior During Session: [facility] for tasks performed    Extremity/Trunk Assessment Right Upper  Extremity Assessment RUE ROM/Strength/Tone: Within functional levels (  c/o ligament/RC problem) Left Upper Extremity Assessment LUE ROM/Strength/Tone: Within functional levels Right Lower Extremity Assessment RLE ROM/Strength/Tone: WFL for tasks assessed Left Lower Extremity Assessment LLE ROM/Strength/Tone: Unable to fully assess;Due to pain   Balance    End of Session PT - End of Session Activity Tolerance: Patient limited by pain Patient left: in chair;with call bell/phone within reach Nurse Communication: Mobility status;Need for lift equipment;Weight bearing status  GP     Rebeca Alert [facility] 12/02/2011, 10:33 AM [phone]

## 2011-12-03 DIAGNOSIS — R634 Abnormal weight loss: Secondary | ICD-10-CM

## 2011-12-03 DIAGNOSIS — I5032 Chronic diastolic (congestive) heart failure: Secondary | ICD-10-CM

## 2011-12-03 LAB — BASIC METABOLIC PANEL
BUN: 14 mg/dL (ref 6–23)
Chloride: 102 mEq/L (ref 96–112)
Creatinine, Ser: 0.94 mg/dL (ref 0.50–1.10)
GFR calc Af Amer: 62 mL/min — ABNORMAL LOW (ref >90)
GFR calc non Af Amer: 54 mL/min — ABNORMAL LOW (ref >90)

## 2011-12-03 LAB — CBC
HCT: 25 % — ABNORMAL LOW (ref 36.0–46.0)
MCHC: 33.6 g/dL (ref 30.0–36.0)
RDW: 14.6 % (ref 11.5–15.5)

## 2011-12-03 MED ORDER — FUROSEMIDE 10 MG/ML IJ SOLN
20.0000 mg | Freq: Once | INTRAMUSCULAR | Status: AC
  Administered 2011-12-04: 20 mg via INTRAVENOUS
  Filled 2011-12-03: qty 2

## 2011-12-03 MED ORDER — POTASSIUM CHLORIDE CRYS ER 20 MEQ PO TBCR
40.0000 meq | EXTENDED_RELEASE_TABLET | ORAL | Status: AC
  Administered 2011-12-03: 40 meq via ORAL
  Filled 2011-12-03: qty 2

## 2011-12-03 MED ORDER — FERROUS FUMARATE 325 (106 FE) MG PO TABS
1.0000 | ORAL_TABLET | Freq: Two times a day (BID) | ORAL | Status: DC
  Administered 2011-12-04 (×2): 106 mg via ORAL
  Filled 2011-12-03 (×4): qty 1

## 2011-12-03 NOTE — Progress Notes (Signed)
[facility] PROGRESS NOTE  LLUVIA GWYNNE ZOX:[id number] DOB: [birth year] DOA: 11/30/2011 PCP: Samuel Jester, DO  Assessment/Plan: 1. L intertrochanteric hip fracture: Appreciate ortho input and assistance in the care. Patient is s/p intramedullary nail placement on her left hip (post-op day #2). Will follow further pos-operative rec's from ortho group. Continue supportive care and PRN pain meds. Per physical therapy and occupational therapy evaluation she needs SNf for rehab; social worker has been consulted and is looking for placement. Wants [place] [facility]'s ([facility]). Vitamin D 30 2. Uti: tx with levaquin 500mg  iv qday for a total of 5 days. Urine cx was never taken before antibiotics; will treat empirically. Day 3/5 3. Anemia: preliminary anemia panel results consistent with iron deficiency anemia. Will start ferrous fumarate and since hx of CAD and needs for Hgb >9.0 will transfuse 1 unit of PRBC's today. CBC in am. 4. Wt loss: pt has CEA elevation and hx of tubular adenoma fragment on colonoscopy 2007, will recommend further evaluation with colonoscopy as outpatient. Patient and family aware and planning to do it. 5. CAD s/p MI, RCA stent: cont asa, plavix. Patient is CP free and cardiac enzymes negative. Will continue b-blocker. Telemetry demonstrated some PVCs but no other acute ischemic changes; she had hx of PVC's in the past. Will keep electrolytes WNL  and continue monitoring. 6. Hypokalemia: repleted. Since patient will received another dose of lasix after transfusion today will give one dose of potassium. BMET in am. 7. H/o CHF (mixed pattern, EF 45%): will follow close I/O, continue b-blockers. At this point patient without any signs of fluid overload and her CHF appears compensated.  Will continue daily weight 8. Gerd: cont protonix 9. Anxiety/insomnia: continue ativan QHS.    Code Status: Full Family Communication: Daughter at bedside Disposition Plan: most  likely SNF for physical rehab   Brief narrative: 78 yo female with hx of CAD s/p ptca/stent RCA 10/08/2011, ?CHF, R THA, Wt loss (+ elevation in CEA), h/o breast cancer, Presents with c/o mechanical fall leaving [place] going to her car. In ED discovered to have L intertrochanteric hip fracture, and uti. Pt is also noted to have mild anemia.  Consultants:  orthopedics  Procedures:  Left intramedullary nail  Antibiotics:  levaquin  HPI/Subjective: Afebrile, no CP, no abdominal pain, no N/V. Patient reports some pain in her left hip; otherwise no acute distress. Patient reports generalized weak feeling.  Objective: Filed Vitals:   12/02/11 2135 12/03/11 0633 12/03/11 0717 12/03/11 1514  BP: 185/69 163/76 154/65 157/73  Pulse: 80 85 79 73  Temp: 99 F (37.2 C) 100 F (37.8 C) 99.7 F (37.6 C) 98.7 F (37.1 C)  TempSrc: Oral Oral Oral Oral  Resp: 18 18 18 18   Height:      Weight:   52.2 kg (115 lb 1.3 oz)   SpO2: 94% 94% 95% 95%    Intake/Output Summary (Last 24 hours) at 12/03/11 1609 Last data filed at 12/03/11 1513  Gross per 24 hour  Intake    240 ml  Output    975 ml  Net   -735 ml   Filed Weights   12/01/11 0500 12/02/11 0500 12/03/11 0717  Weight: 53.4 kg (117 lb 11.6 oz) 53.3 kg (117 lb 8.1 oz) 52.2 kg (115 lb 1.3 oz)    Exam:   General:  NAD  Cardiovascular: S1 and S2; no rubs  Respiratory: good air movement; bibasilar ronchi appreciated  Abdomen: soft; NT, ND, positive BS  Extremities: Left lower strandy this with heat 1 Dressings in Pl., no drainage, no erythema; sore on palpation.  Neurologic: AAOX3; intact CN; no focal motor deficit  Data Reviewed: Basic Metabolic Panel:  Lab 12/03/11 1610 12/02/11 0442 12/01/11 0545 11/30/11 1859  NA 133* 137 138 137  K 4.5 3.3* 3.6 3.4*  CL 102 101 105 102  CO2 27 25 23 26   GLUCOSE 135* 113* 121* 125*  BUN 14 16 14 16   CREATININE 0.94 1.14* 1.00 1.08  CALCIUM 8.4 8.1* 8.8 9.1  MG -- -- -- --  PHOS  -- -- -- --   Liver Function Tests:  Lab 12/01/11 0545  AST 82*  ALT 63*  ALKPHOS 648*  BILITOT 0.6  PROT 6.1  ALBUMIN 3.0*   CBC:  Lab 12/03/11 0504 12/02/11 0442 12/01/11 0545 11/30/11 1859  WBC 14.2* 13.1* 10.7* 11.8*  NEUTROABS -- -- 7.3 8.6*  HGB 8.4* 9.3* 9.5* 10.7*  HCT 25.0* 27.2* 28.6* 32.7*  MCV 90.6 89.5 91.7 93.7  PLT 191 198 217 242   Cardiac Enzymes:  Lab 12/01/11 0545  CKTOTAL 767*  CKMB 6.5*  CKMBINDEX --  TROPONINI <0.30    Recent Results (from the past 240 hour(s))  SURGICAL PCR SCREEN     Status: Normal   Collection Time   12/01/11 12:37 AM      Component Value Range Status Comment   MRSA, PCR NEGATIVE  NEGATIVE Final    Staphylococcus aureus NEGATIVE  NEGATIVE Final      Studies: Dg Chest 1 View  11/30/2011  *RADIOLOGY REPORT*  Clinical Data: Fall.  CHEST - 1 VIEW  Comparison: October 09, 2011.  Findings: Cardiomediastinal silhouette appears normal.  No acute pulmonary disease is noted.  Surgical clips are noted in left axillary region.  IMPRESSION: No acute cardiopulmonary abnormality seen.  Original Report Authenticated By: Venita Sheffield., M.D.   Dg Hip Complete Left  11/30/2011  *RADIOLOGY REPORT*  Clinical Data: fall, deformity.  LEFT HIP - COMPLETE 2+ VIEW  Comparison: None.  Findings: There is a left femoral intertrochanteric fracture with mild varus angulation.  No subluxation or dislocation.  Hardware noted within the right hip from remote injury.  IMPRESSION: Left intertrochanteric femoral fracture.  Original Report Authenticated By: Cyndie Chime, M.D.   Dg Femur Left  12/01/2011  *RADIOLOGY REPORT*  Clinical Data: 78 year old female status post left proximal femur ORIF.  LEFT FEMUR - 2 VIEW  Comparison: Preoperative study 11/30/2011.  Fluoroscopy time of 1.5 minutes was utilized.  Findings: Four intraoperative fluoroscopic views of the proximal left femur.  Intramedullary rod is in place with proximal interlocking dynamic hip screw and  distal interlocking cortical screw.  Near anatomic alignment at the intertrochanteric fracture.  IMPRESSION: ORIF proximal left femur no adverse features.  Original Report Authenticated By: Harley Hallmark, M.D.   Dg Pelvis Portable  12/01/2011  *RADIOLOGY REPORT*  Clinical Data: Postop ORIF left hip.  PORTABLE PELVIS  Comparison: 12/01/2011  Findings: The patient has undergone placement of intramedullary nail with sliding screw across a left intertrochanteric fracture. Previous ORIF of the right hip also noted.  There is no evidence for dislocation on this frontal view.  Degenerative changes are seen in the lower spine and both hips.  There is dense atherosclerotic calcification of the femoral arteries.  Regional bowel gas pattern is nonobstructive.  IMPRESSION:  1.  Status post ORIF of the left hip.  No adverse features identified on this frontal view. 2.  Previous ORIF right hip.  Original Report Authenticated By: Patterson Hammersmith, M.D.    Scheduled Meds:    . acidophilus  1 capsule Oral Daily  . atorvastatin  80 mg Oral q1800  . diphenhydrAMINE  25 mg Oral QHS  . docusate sodium  100 mg Oral BID  . enoxaparin (LOVENOX) injection  30 mg Subcutaneous Q12H  . furosemide  20 mg Intravenous Once  . levofloxacin  250 mg Oral Q24H  . LORazepam  0.5 mg Oral QHS  . metoprolol tartrate  25 mg Oral BID  . pantoprazole  40 mg Oral Q1200  . polyethylene glycol  17 g Oral Daily  . potassium chloride  40 mEq Oral Q4H  . sodium chloride  3 mL Intravenous Q12H   Continuous Infusions:    . sodium chloride 10 mL/hr at 12/03/11 0419    Time spent: > 30 minutes    Demetria Lightsey  [facility] Pager [phone]. If 8PM-8AM, please contact night-coverage at [email or web address], password [facility] 12/03/2011, 4:09 PM  LOS: 3 days

## 2011-12-03 NOTE — Progress Notes (Signed)
Subjective: 2 Days Post-Op Procedure(s) (LRB): INTRAMEDULLARY (IM) NAIL FEMORAL (Left) Patient reports pain as 4 on 0-10 scale.    Objective: Vital signs in last 24 hours: Temp:  [98.8 F (37.1 C)-100 F (37.8 C)] 99.7 F (37.6 C) (08/20 0717) Pulse Rate:  [79-85] 79  (08/20 0717) Resp:  [18] 18  (08/20 0717) BP: (148-185)/(65-76) 154/65 mmHg (08/20 0717) SpO2:  [94 %-98 %] 95 % (08/20 0717) Weight:  [52.2 kg (115 lb 1.3 oz)] 52.2 kg (115 lb 1.3 oz) (08/20 0717)  Intake/Output from previous day: 08/19 0701 - 08/20 0700 In: 180 [P.O.:180] Out: 1225 [Urine:1225] Intake/Output this shift: Total I/O In: 120 [P.O.:120] Out: -    Basename 12/03/11 0504 12/02/11 0442 12/01/11 0545 11/30/11 1859  HGB 8.4* 9.3* 9.5* 10.7*    Basename 12/03/11 0504 12/02/11 0442  WBC 14.2* 13.1*  RBC 2.76* 3.04*  HCT 25.0* 27.2*  PLT 191 198    Basename 12/03/11 0504 12/02/11 0442  NA 133* 137  K 4.5 3.3*  CL 102 101  CO2 27 25  BUN 14 16  CREATININE 0.94 1.14*  GLUCOSE 135* 113*  CALCIUM 8.4 8.1*    Basename 11/30/11 2145  LABPT --  INR 0.97    Neurologically intact. Dressing changed. I: CDI  Assessment/Plan: 2 Days Post-Op Procedure(s) (LRB): INTRAMEDULLARY (IM) NAIL FEMORAL (Left) Discharge to SNF . PWB LLE. Lovenox until D/C then ASA plavix. Dressing change QD. Check H/H. Possible transfusion. Asx anemia blood loss.  Sola Margolis C 12/03/2011, 12:49 PM

## 2011-12-03 NOTE — Progress Notes (Signed)
Physical Therapy Treatment Patient Details Name: Kristen Rodriguez MRN: [id number] DOB: 1923/05/16 Today's Date: 12/03/2011 Time: 7829-5621 PT Time Calculation (min): 30 min  PT Assessment / Plan / Recommendation Comments on Treatment Session  Pt educated on her surgery and WB status.  Assisted pt OOB to [facility] then to recliner.  Hoyer pad in chair and instructed NT to use lift to get back to bed. Pt plans to D/C to SNF for Rehab.    Follow Up Recommendations  Skilled nursing facility    Barriers to Discharge        Equipment Recommendations  Defer to next venue    Recommendations for Other Services    Frequency Min 3X/week   Plan Discharge plan remains appropriate    Precautions / Restrictions Precautions Precautions: Fall Restrictions Weight Bearing Restrictions: Yes LLE Weight Bearing: Partial weight bearing (Per Ortho note today)    Pertinent Vitals/Pain C/o nausea Pre medicated for pain ICE applied to L hip    Mobility  Bed Mobility Bed Mobility: Supine to Sit Supine to Sit: 1: +2 Total assist Supine to Sit: Patient Percentage: 20% Details for Bed Mobility Assistance: increased time and use of bed pad to swival hips around  Transfers Transfers: Sit to Stand;Stand to Sit Sit to Stand: 1: +2 Total assist;From bed;From toilet Sit to Stand: Patient Percentage: 30% Stand to Sit: 1: +2 Total assist;To toilet;To chair/3-in-1 Stand to Sit: Patient Percentage: 30% Details for Transfer Assistance: 75% VC's on proper tech and hand placement plus increased time.  was able to use RW this time . Ambulation/Gait Ambulation/Gait Assistance: Not tested (comment) (bed to [facility] to chair only)     PT Goals            progressing          Subjective Data  Subjective: I feel nausea Patient Stated Goal: Rehab   Cognition    good Pt recalls event   Balance   poor  End of Session PT - End of Session Equipment Utilized During Treatment: Gait belt Activity Tolerance: Patient  limited by fatigue;Patient limited by pain;Other (comment) (tx limited by nausea)   Felecia Shelling  PTA [facility]  Acute  Rehab Pager     [phone]

## 2011-12-03 NOTE — Clinical Social Work Psychosocial (Unsigned)
     Clinical Social Work Department BRIEF PSYCHOSOCIAL ASSESSMENT 12/03/2011  Patient:  Kristen Rodriguez, Kristen Rodriguez     Account Number:  [id number]     Admit date:  11/30/2011  Clinical Social Worker:  Hattie Perch  Date/Time:  12/03/2011 12:00 M  Referred by:  Physician  Date Referred:  12/03/2011 Referred for  SNF Placement  SNF Placement   Other Referral:   Interview type:  Patient Other interview type:    PSYCHOSOCIAL DATA Living Status:  ALONE Admitted from facility:   Level of care:   Primary support name:  Ronnald Ramp Primary support relationship to patient:  FAMILY Degree of support available:   fair    CURRENT CONCERNS Current Concerns  Post-Acute Placement  Post-Acute Placement   Other Concerns:    SOCIAL WORK ASSESSMENT / PLAN CSW met with patient. patient is alert and oriented X3. patient is agreeable to need for snf. patient requesting [facility] as first choice. any other facility in [place] as a back up.   Assessment/plan status:   Other assessment/ plan:   Information/referral to community resources:    PATIENTS/FAMILYS RESPONSE TO PLAN OF CARE: patient agreeable to being faxed out to [place] snfs.

## 2011-12-04 ENCOUNTER — Encounter (HOSPITAL_COMMUNITY): Payer: Self-pay | Admitting: Specialist

## 2011-12-04 LAB — BASIC METABOLIC PANEL
BUN: 13 mg/dL (ref 6–23)
Creatinine, Ser: 0.83 mg/dL (ref 0.50–1.10)
GFR calc Af Amer: 72 mL/min — ABNORMAL LOW (ref >90)
GFR calc non Af Amer: 63 mL/min — ABNORMAL LOW (ref >90)
Glucose, Bld: 115 mg/dL — ABNORMAL HIGH (ref 70–99)

## 2011-12-04 LAB — TYPE AND SCREEN
Antibody Screen: NEGATIVE
Unit division: 0

## 2011-12-04 LAB — CBC
MCH: 29.6 pg (ref 26.0–34.0)
MCHC: 34 g/dL (ref 30.0–36.0)
RDW: 15.7 % — ABNORMAL HIGH (ref 11.5–15.5)

## 2011-12-04 MED ORDER — DSS 100 MG PO CAPS: 100.0000 mg | ORAL_CAPSULE | Freq: Two times a day (BID) | ORAL | Status: AC

## 2011-12-04 MED ORDER — LACTULOSE 10 GM/15ML PO SOLN
10.0000 g | Freq: Every day | ORAL | Status: DC | PRN
  Filled 2011-12-04: qty 15

## 2011-12-04 MED ORDER — LORAZEPAM 0.5 MG PO TABS: 0.5000 mg | ORAL_TABLET | Freq: Every day | ORAL | Status: AC

## 2011-12-04 MED ORDER — METHOCARBAMOL 500 MG PO TABS: 500.0000 mg | ORAL_TABLET | Freq: Four times a day (QID) | ORAL | Status: AC | PRN

## 2011-12-04 MED ORDER — LEVOFLOXACIN 250 MG PO TABS: 250.0000 mg | ORAL_TABLET | ORAL | Status: AC

## 2011-12-04 MED ORDER — HYDROCODONE-ACETAMINOPHEN 5-325 MG PO TABS: 1.0000 | ORAL_TABLET | Freq: Four times a day (QID) | ORAL | Status: AC | PRN

## 2011-12-04 MED ORDER — FERROUS FUMARATE 325 (106 FE) MG PO TABS: 1.0000 | ORAL_TABLET | Freq: Two times a day (BID) | ORAL | Status: DC

## 2011-12-04 MED ORDER — HYDRALAZINE HCL 10 MG PO TABS: 5.0000 mg | ORAL_TABLET | Freq: Four times a day (QID) | ORAL | Status: DC | PRN

## 2011-12-04 NOTE — Progress Notes (Signed)
Subjective: 3 Days Post-Op Procedure(s) (LRB): INTRAMEDULLARY (IM) NAIL FEMORAL (Left) Patient reports pain as 3 on 0-10 scale.    Objective: Vital signs in last 24 hours: Temp:  [98.4 F (36.9 C)-99.7 F (37.6 C)] 99.4 F (37.4 C) (08/21 0627) Pulse Rate:  [73-88] 85  (08/21 0627) Resp:  [18] 18  (08/21 0627) BP: (123-173)/(51-77) 160/73 mmHg (08/21 0627) SpO2:  [95 %-99 %] 98 % (08/21 0627) Weight:  [52.2 kg (115 lb 1.3 oz)] 52.2 kg (115 lb 1.3 oz) (08/21 0627)  Intake/Output from previous day: 08/20 0701 - 08/21 0700 In: 810 [P.O.:360; Blood:450] Out: 1275 [Urine:1275] Intake/Output this shift:     Basename 12/04/11 0450 12/03/11 0504 12/02/11 0442  HGB 9.8* 8.4* 9.3*    Basename 12/04/11 0450 12/03/11 0504  WBC 10.9* 14.2*  RBC 3.31* 2.76*  HCT 28.8* 25.0*  PLT 177 191    Basename 12/04/11 0450 12/03/11 0504  NA 132* 133*  K 4.1 4.5  CL 99 102  CO2 25 27  BUN 13 14  CREATININE 0.83 0.94  GLUCOSE 115* 135*  CALCIUM 8.7 8.4   No results found for this basename: LABPT:2,INR:2 in the last 72 hours  Neurologically intact Sensation intact distally Intact pulses distally Incision: moderate drainage No cellulitis present Compartment soft Bloody drainage on dressing. Not active Assessment/Plan: 3 Days Post-Op Procedure(s) (LRB): INTRAMEDULLARY (IM) NAIL FEMORAL (Left) Discharge to SNF. Dsg changes TID. PWB left. D/C on plavix/ASA.   Kristen Rodriguez C 12/04/2011, 8:24 AM

## 2011-12-04 NOTE — Progress Notes (Addendum)
Patient discharged to SNF, alert and oriented, discharge package given to Medical Transportation for delivery to SNF, patient in stable condition at this time, dsg on Left Hip dry and intact with small amount of bloody drainage, OOB at time of discharge.

## 2011-12-04 NOTE — Progress Notes (Signed)
Physical Therapy Treatment Patient Details Name: JAMAE TISON MRN: [id number] DOB: [birth year] Today's Date: 12/04/2011 Time: 4098-1191 PT Time Calculation (min): 20 min  PT Assessment / Plan / Recommendation Comments on Treatment Session  Assisted pt OOB to [facility] to void.  Pt expressed concern that she has not had a BM for several days.  No c/o ABD pain/discomfort.  Assisted pt off BSC to recliner.  Pt unable to take functional steps 2nd pain level and inability to support self fully upright.  Pt plans to D/C to SNF for ST Rehab.    Follow Up Recommendations  Skilled nursing facility    Barriers to Discharge        Equipment Recommendations  Defer to next venue    Recommendations for Other Services    Frequency     Plan Discharge plan remains appropriate    Precautions / Restrictions Precautions Precautions: Fall Precaution Comments: L IM nail s/p femur Fx Restrictions Weight Bearing Restrictions: Yes LLE Weight Bearing: Partial weight bearing    Pertinent Vitals/Pain C/o "alot" L hip pain    Mobility  Bed Mobility Bed Mobility: Supine to Sit;Sitting - Scoot to Edge of Bed Supine to Sit: 1: +1 Total assist Supine to Sit: Patient Percentage: 20% Sitting - Scoot to Edge of Bed: 1: +1 Total assist Details for Bed Mobility Assistance: using bed pad to assist with EOB pt 0% 2dn pain  Transfers Transfers: Sit to Stand;Stand to Sit Sit to Stand: 1: +1 Total assist;From bed;From toilet Sit to Stand: Patient Percentage: 40% Stand to Sit: 1: +1 Total assist;To toilet;To chair/3-in-1 Stand to Sit: Patient Percentage: 40% Details for Transfer Assistance: 75% VC's on proper technique and hand placement plus increase time to complete turn.  Ambulation/Gait Ambulation/Gait Assistance: Not tested (comment) (unable this session 2nd pain level and difficulty with trans)     PT Goals                           progressing    Visit Information  Last PT Received On:  12/04/11 Assistance Needed: +2    Subjective Data  Subjective: I hurt really bad Patient Stated Goal: no pain   Cognition  Overall Cognitive Status: Appears within functional limits for tasks assessed/performed Arousal/Alertness: Awake/alert Orientation Level: Appears intact for tasks assessed Behavior During Session: [facility] for tasks performed    Balance   poor  End of Session PT - End of Session Equipment Utilized During Treatment: Gait belt Activity Tolerance: Patient limited by pain Patient left: in chair;with call bell/phone within reach Nurse Communication: Mobility status;Need for lift equipment;Patient requests pain meds   Felecia Shelling  PTA [facility]  Acute  Rehab Pager     [phone]

## 2011-12-04 NOTE — Progress Notes (Signed)
CSW met with patient. [facility] has no beds today. Patient requesting [facility]. Patient cleared for discharge. Packet copied and placed in [place]. Patient and daughter at bedside informed and agreeable. ptar called for transportation.  Sally Menard C. Quintana Canelo MSW, LCSW [phone]

## 2011-12-04 NOTE — Discharge Summary (Signed)
Physician Discharge Summary  Kristen Rodriguez MVH:[id number] DOB: [birth year] DOA: 11/30/2011  PCP: Kristen Jester, DO  Admit date: 11/30/2011 Discharge date: 12/04/2011  Recommendations for Outpatient Follow-up:  CBC on Monday for Hgb One more day of antibiotic  Discharge Diagnoses:  Principal Problem:  *Hip fracture Active Problems:  UTI (lower urinary tract infection)  Fall   Discharge Condition: improved  Diet recommendation: cardiac- fluid restrict to 1.5 L  Filed Weights   12/02/11 0500 12/03/11 0717 12/04/11 0627  Weight: 53.3 kg (117 lb 8.1 oz) 52.2 kg (115 lb 1.3 oz) 52.2 kg (115 lb 1.3 oz)    History of present illness:  78 yo female with hx of CAD s/p ptca/stent RCA 10/08/2011, ?CHF, R THA, Wt loss (+ elevation in CEA), h/o breast cancer, Presents with c/o mechanical fall leaving [place] going to her car. In ED discovered to have L intertrochanteric hip fracture, and uti. Pt is also noted to have mild anemia. [facility] has been consulted by ED, and expect to perform surgery at about 10:30am tomorrow morning. They are aware of recent cardiac stent and fact that she is on plavix, asa.    Hospital Course:  1.  L intertrochanteric hip fracture: s/p intramedullary nail placement on her left hip (post-op day #3). Dsg changes TID. PWB left, ok to d/c on ASA/plavix 2. Uti: tx with levaquin 250mg  po qday for a total of 5 days. Urine cx was never taken before antibiotics; will treat empirically. Day 4/5 3.  Anemia: preliminary anemia panel results consistent with iron deficiency anemia. Will start ferrous fumarate and since hx of CAD and needs for Hgb >9.0 s/p 1 unit PRBCs yesterday 4  Wt loss: pt has CEA elevation and hx of tubular adenoma fragment on colonoscopy 2007, will recommend further evaluation with colonoscopy as outpatient. Patient and family aware and planning to do it. 5.  CAD s/p MI, RCA stent: cont asa, plavix. 6. hypokalemia: repleted.  7.  H/o CHF (mixed  pattern, EF 45%): will follow close I/O, continue b-blockers. At this point patient without any signs of fluid overload and her CHF appears compensated. Will continue daily weigh 8.  Gerd: cont protonix 9.  Anxiety/insomnia: continue ativan QHS.   Procedures:  Intramedullary nail- femoral  Consultations:  ortho  Discharge Exam: Filed Vitals:   12/04/11 0627  BP: 160/73  Pulse: 85  Temp: 99.4 F (37.4 C)  Resp: 18   Filed Vitals:   12/03/11 2200 12/03/11 2245 12/04/11 0015 12/04/11 0627  BP: 124/60 159/77 173/77 160/73  Pulse: 84 88 86 85  Temp: 98.7 F (37.1 C) 99.2 F (37.3 C) 99.7 F (37.6 C) 99.4 F (37.4 C)  TempSrc: Oral Oral Oral Oral  Resp: 18 18 18 18   Height:      Weight:    52.2 kg (115 lb 1.3 oz)  SpO2: 95% 96% 98% 98%    General: pleasant/cooperative Cardiovascular: rrr Respiratory: clear anterior, no wheezing Skin: no rashes or lesions   Discharge Instructions  Discharge Orders    Future Appointments: Provider: Department: Dept Phone: Center:   02/06/2012 1:20 PM Kristen Parma, PA [facility] Kristen Rodriguez [phone] [id number]     Future Orders Please Complete By Expires   Diet - low sodium heart healthy      Increase activity slowly      Discharge instructions      Comments:   Dsg changes TID. PWB left     Medication List  As of 12/04/2011 12:42 PM  STOP taking these medications         diphenhydrAMINE 25 mg capsule      HYDROcodone-acetaminophen 5-500 MG per tablet         TAKE these medications         aspirin 81 MG chewable tablet   Chew 81 mg by mouth daily.      atorvastatin 80 MG tablet   Commonly known as: LIPITOR   Take 80 mg by mouth daily at 6 PM.      clopidogrel 75 MG tablet   Commonly known as: PLAVIX   Take 75 mg by mouth daily with breakfast.      DSS 100 MG Caps   Take 100 mg by mouth 2 (two) times daily.      ferrous fumarate 325 (106 FE) MG Tabs   Commonly known as: HEMOCYTE - 106 mg FE   Take 1  tablet (106 mg of iron total) by mouth 2 (two) times daily.      hydrALAZINE 10 MG tablet   Commonly known as: APRESOLINE   Take 0.5 tablets (5 mg total) by mouth every 6 (six) hours as needed (sbp >160).      HYDROcodone-acetaminophen 5-325 MG per tablet   Commonly known as: NORCO/VICODIN   Take 1 tablet by mouth every 6 (six) hours as needed for pain.      levofloxacin 250 MG tablet   Commonly known as: LEVAQUIN   Take 1 tablet (250 mg total) by mouth daily.      LORazepam 0.5 MG tablet   Commonly known as: ATIVAN   Take 1 tablet (0.5 mg total) by mouth at bedtime.      methocarbamol 500 MG tablet   Commonly known as: ROBAXIN   Take 1 tablet (500 mg total) by mouth every 6 (six) hours as needed.      metoprolol tartrate 25 MG tablet   Commonly known as: LOPRESSOR   Take 25 mg by mouth 2 (two) times daily.      MULTIVITAMIN PO   Take 1 tablet by mouth daily.      pantoprazole 40 MG tablet   Commonly known as: PROTONIX   Take 40 mg by mouth daily at 6 (six) AM.      polyethylene glycol packet   Commonly known as: MIRALAX / GLYCOLAX   Take 17 g by mouth daily.      RESTORA Caps   Take 1 capsule by mouth daily.              The results of significant diagnostics from this hospitalization (including imaging, microbiology, ancillary and laboratory) are listed below for reference.    Significant Diagnostic Studies: Dg Chest 1 View  11/30/2011  *RADIOLOGY REPORT*  Clinical Data: Fall.  CHEST - 1 VIEW  Comparison: October 09, 2011.  Findings: Cardiomediastinal silhouette appears normal.  No acute pulmonary disease is noted.  Surgical clips are noted in left axillary region.  IMPRESSION: No acute cardiopulmonary abnormality seen.  Original Report Authenticated By: Kristen Rodriguez., M.D.   Dg Hip Complete Left  11/30/2011  *RADIOLOGY REPORT*  Clinical Data: fall, deformity.  LEFT HIP - COMPLETE 2+ VIEW  Comparison: None.  Findings: There is a left femoral intertrochanteric  fracture with mild varus angulation.  No subluxation or dislocation.  Hardware noted within the right hip from remote injury.  IMPRESSION: Left intertrochanteric femoral fracture.  Original Report Authenticated By: Kristen Rodriguez, M.D.   Dg Femur Left  12/01/2011  *RADIOLOGY REPORT*  Clinical Data: 78 year old female status post left proximal femur ORIF.  LEFT FEMUR - 2 VIEW  Comparison: Preoperative study 11/30/2011.  Fluoroscopy time of 1.5 minutes was utilized.  Findings: Four intraoperative fluoroscopic views of the proximal left femur.  Intramedullary rod is in place with proximal interlocking dynamic hip screw and distal interlocking cortical screw.  Near anatomic alignment at the intertrochanteric fracture.  IMPRESSION: ORIF proximal left femur no adverse features.  Original Report Authenticated By: Harley Hallmark, M.D.   Dg Pelvis Portable  12/01/2011  *RADIOLOGY REPORT*  Clinical Data: Postop ORIF left hip.  PORTABLE PELVIS  Comparison: 12/01/2011  Findings: The patient has undergone placement of intramedullary nail with sliding screw across a left intertrochanteric fracture. Previous ORIF of the right hip also noted.  There is no evidence for dislocation on this frontal view.  Degenerative changes are seen in the lower spine and both hips.  There is dense atherosclerotic calcification of the femoral arteries.  Regional bowel gas pattern is nonobstructive.  IMPRESSION:  1.  Status post ORIF of the left hip.  No adverse features identified on this frontal view. 2.  Previous ORIF right hip.  Original Report Authenticated By: Patterson Hammersmith, M.D.   Dg C-arm 1-60 Min-no Report  12/01/2011  CLINICAL DATA: fracture left hip   C-ARM 1-60 MINUTES  Fluoroscopy was utilized by the requesting physician.  No radiographic  interpretation.      Microbiology: Recent Results (from the past 240 hour(s))  SURGICAL PCR SCREEN     Status: Normal   Collection Time   12/01/11 12:37 AM      Component Value  Range Status Comment   MRSA, PCR NEGATIVE  NEGATIVE Final    Staphylococcus aureus NEGATIVE  NEGATIVE Final      Labs: Basic Metabolic Panel:  Lab 12/04/11 6213 12/03/11 0504 12/02/11 0442 12/01/11 0545 11/30/11 1859  NA 132* 133* 137 138 137  K 4.1 4.5 3.3* 3.6 3.4*  CL 99 102 101 105 102  CO2 25 27 25 23 26   GLUCOSE 115* 135* 113* 121* 125*  Rodriguez 13 14 16 14 16   CREATININE 0.83 0.94 1.14* 1.00 1.08  CALCIUM 8.7 8.4 8.1* 8.8 9.1  MG -- -- -- -- --  PHOS -- -- -- -- --   Liver Function Tests:  Lab 12/01/11 0545  AST 82*  ALT 63*  ALKPHOS 648*  BILITOT 0.6  PROT 6.1  ALBUMIN 3.0*   No results found for this basename: LIPASE:5,AMYLASE:5 in the last 168 hours No results found for this basename: AMMONIA:5 in the last 168 hours CBC:  Lab 12/04/11 0450 12/03/11 0504 12/02/11 0442 12/01/11 0545 11/30/11 1859  WBC 10.9* 14.2* 13.1* 10.7* 11.8*  NEUTROABS -- -- -- 7.3 8.6*  HGB 9.8* 8.4* 9.3* 9.5* 10.7*  HCT 28.8* 25.0* 27.2* 28.6* 32.7*  MCV 87.0 90.6 89.5 91.7 93.7  PLT 177 191 198 217 242   Cardiac Enzymes:  Lab 12/01/11 0545  CKTOTAL 767*  CKMB 6.5*  CKMBINDEX --  TROPONINI <0.30   BNP: BNP (last 3 results) No results found for this basename: PROBNP:3 in the last 8760 hours CBG: No results found for this basename: GLUCAP:5 in the last 168 hours  Time coordinating discharge: 45 minutes  Signed:  Marlin Canary  [facility] 12/04/2011, 12:42 PM

## 2012-01-09 ENCOUNTER — Encounter (INDEPENDENT_AMBULATORY_CARE_PROVIDER_SITE_OTHER): Payer: Medicare Other | Admitting: Hematology and Oncology

## 2012-01-09 DIAGNOSIS — I252 Old myocardial infarction: Secondary | ICD-10-CM

## 2012-01-09 DIAGNOSIS — M899 Disorder of bone, unspecified: Secondary | ICD-10-CM

## 2012-01-09 DIAGNOSIS — M949 Disorder of cartilage, unspecified: Secondary | ICD-10-CM

## 2012-01-09 DIAGNOSIS — Z853 Personal history of malignant neoplasm of breast: Secondary | ICD-10-CM

## 2012-01-09 DIAGNOSIS — E785 Hyperlipidemia, unspecified: Secondary | ICD-10-CM

## 2012-02-05 ENCOUNTER — Encounter: Payer: Self-pay | Admitting: Physician Assistant

## 2012-02-06 ENCOUNTER — Ambulatory Visit: Payer: 59 | Admitting: Physician Assistant

## 2012-02-06 ENCOUNTER — Ambulatory Visit (INDEPENDENT_AMBULATORY_CARE_PROVIDER_SITE_OTHER): Payer: Medicare Other | Admitting: Physician Assistant

## 2012-02-06 ENCOUNTER — Encounter: Payer: Self-pay | Admitting: Physician Assistant

## 2012-02-06 VITALS — BP 151/76 | HR 58 | Ht 62.0 in | Wt 91.8 lb

## 2012-02-06 DIAGNOSIS — I2510 Atherosclerotic heart disease of native coronary artery without angina pectoris: Secondary | ICD-10-CM

## 2012-02-06 DIAGNOSIS — R7989 Other specified abnormal findings of blood chemistry: Secondary | ICD-10-CM

## 2012-02-06 DIAGNOSIS — I5032 Chronic diastolic (congestive) heart failure: Secondary | ICD-10-CM

## 2012-02-06 DIAGNOSIS — E782 Mixed hyperlipidemia: Secondary | ICD-10-CM

## 2012-02-06 NOTE — Assessment & Plan Note (Signed)
In light of current elevated LFTs, Lipitor will be discontinued. Will order followup LFTs in 2 weeks.

## 2012-02-06 NOTE — Progress Notes (Signed)
Primary Cardiologist:  HPI: Patient presents for scheduled three-month followup.  Recent followup surveillance lipid profile, October 23, as follows: Alkaline phosphatase 642, AST 105, and ALT 89. LDL 53.  Clinically, she reports no interim exertional CP. Of note, she suffered a fall in August, requiring surgical repair of a left hip fracture, performed at [facility]. There were no documented postop cardiac complications.   Allergies  Allergen Reactions  . Codeine Nausea Only  . Penicillins Other (See Comments)    unknown  . Sulfa Antibiotics Nausea Only    Current Outpatient Prescriptions  Medication Sig Dispense Refill  . aspirin 81 MG chewable tablet Chew 81 mg by mouth daily.      . calcium-vitamin D (OSCAL WITH D) 500-200 MG-UNIT per tablet Take 1 tablet by mouth 2 (two) times daily.      . clopidogrel (PLAVIX) 75 MG tablet Take 75 mg by mouth daily with breakfast.      . docusate sodium (COLACE) 100 MG capsule Take 100 mg by mouth 2 (two) times daily.      Marland Kitchen HYDROcodone-acetaminophen (NORCO/VICODIN) 5-325 MG per tablet Take 1 tablet by mouth every 6 (six) hours as needed.      . methocarbamol (ROBAXIN) 500 MG tablet Take 500 mg by mouth every 6 (six) hours as needed.      . metoprolol tartrate (LOPRESSOR) 25 MG tablet Take 25 mg by mouth daily.       . Multiple Vitamin (MULTIVITAMIN PO) Take 1 tablet by mouth daily.        . pantoprazole (PROTONIX) 40 MG tablet Take 40 mg by mouth daily at 6 (six) AM.      . polyethylene glycol (MIRALAX / GLYCOLAX) packet Take 17 g by mouth daily.      . Probiotic Product (RESTORA) CAPS Take 1 capsule by mouth daily.        Past Medical History  Diagnosis Date  . Precordial pain   . Old myocardial infarction   . Hypopotassemia   . Esophageal reflux   . Allergic rhinitis, cause unspecified   . Pure hypercholesterolemia   . Unspecified essential hypertension   . Family history of ischemic heart disease   . CAD (coronary artery  disease) 07/2000    Status Post NSTEMI, 60% mid RCA lesion treated medically  . Breast cancer 2006  . DVT (deep venous thrombosis) 2006  . Macular degeneration   . CHF (congestive heart failure)   . H/O hiatal hernia     Past Surgical History  Procedure Date  . Lumbar laminectomy   . Cataract extraction, bilateral   . Abdominal hysterectomy   . Corneal implant     left  . Cardiac catheterization 2002    Without PCI  . Cardiac catheterization 10/08/2011    1.  Inferior MI with occlusion of the PLA with reperfusion with POBA, stent placed proximally RCA  for high grade disease  . Total hip arthroplasty 02/2010    Right hip by Kinnie Scales Polmeritis    . Colonoscopy 09/17/2005    Tubular adenoma fragments  . Femur im nail 12/01/2011    Procedure: INTRAMEDULLARY (IM) NAIL FEMORAL;  Surgeon: Javier Docker, MD;  Location: [facility];  Service: Orthopedics;  Laterality: Left;  left hipintertrochanteric nail  (Biomet)    History   Social History  . Marital Status: Widowed    Spouse Name: N/A    Number of Children: N/A  . Years of Education: N/A   Occupational History  .  RETIRED    Social History Main Topics  . Smoking status: Never Smoker   . Smokeless tobacco: Never Used  . Alcohol Use: No  . Drug Use: No  . Sexually Active: No   Other Topics Concern  . Not on file   Social History Narrative  . No narrative on file    Family History  Problem Relation Age of Onset  . Heart attack Father 62    Fatal  . Cancer      ROS: no nausea, vomiting; no fever, chills; no melena, hematochezia; no claudication  PHYSICAL EXAM: BP 151/76  Pulse 58  Ht 5\' 2"  (1.575 m)  Wt 91 lb 12.8 oz (41.64 kg)  BMI 16.79 kg/m2 GENERAL: 78 year old female, frail appearing, sitting upright; NAD  HEENT: NCAT, PERRLA, EOMI; sclera clear; no xanthelasma  NECK: palpable bilateral carotid pulses, no bruits; no JVD; no TM  LUNGS: CTA bilaterally  CARDIAC: RRR (S1, S2); no significant murmurs; no rubs or  gallops  ABDOMEN: soft, non-tender; intact BS  EXTREMETIES: no significant peripheral edema  SKIN: warm/dry; no obvious rash/lesions; no jaundice  MUSCULOSKELETAL: no joint deformity  NEURO: no focal deficit; NL affect   EKG:    ASSESSMENT & PLAN:  Transaminitis In light of current elevated LFTs, Lipitor will be discontinued. Will order followup LFTs in 2 weeks.  CAD Quiescent on current medication regimen, which includes DAPT for at least one year. Reassess clinical status in 6 months, at which time she will establish with Dr. Kirke Corin.  Chronic diastolic heart failure Euvolemic by history and exam    Gene Theo Reither, PAC

## 2012-02-06 NOTE — Assessment & Plan Note (Signed)
Euvolemic by history and exam 

## 2012-02-06 NOTE — Assessment & Plan Note (Signed)
Quiescent on current medication regimen, which includes DAPT for at least one year. Reassess clinical status in 6 months, at which time she will establish with Dr. Kirke Corin.

## 2012-02-06 NOTE — Patient Instructions (Addendum)
   Stop Lipitor Continue all other current medications. Lab - due in 2 weeks - liver function Office will contact with results Your physician wants you to follow up in: 6 months.  You will receive a reminder letter in the mail one-two months in advance.  If you don't receive a letter, please call our office to schedule the follow up appointment

## 2012-02-07 ENCOUNTER — Ambulatory Visit: Payer: Self-pay | Admitting: Physician Assistant

## 2012-02-24 ENCOUNTER — Telehealth: Payer: Self-pay | Admitting: *Deleted

## 2012-02-24 NOTE — Telephone Encounter (Signed)
Message copied by Lesle Chris on Mon Feb 24, 2012  3:28 PM ------      Message from: Prescott Parma C      Created: Mon Feb 24, 2012 10:59 AM       Alkaline phosphatase trending down, but still up at 360. Recommend still holding Lipitor and having pt f/u with primary MD, for further evaluation of elevated LFTs.

## 2012-02-24 NOTE — Telephone Encounter (Signed)
Notes Recorded by Lesle Chris, LPN on 60/45/4098 at 3:28 PM Patient notified. Will forward info to PMD Charm Barges). Patient states she has OV with PMD this Thursday.

## 2012-09-23 ENCOUNTER — Encounter: Payer: Self-pay | Admitting: Cardiovascular Disease

## 2012-09-29 ENCOUNTER — Encounter: Payer: Self-pay | Admitting: Cardiovascular Disease

## 2012-11-28 ENCOUNTER — Other Ambulatory Visit: Payer: Self-pay | Admitting: Adult Health

## 2012-12-07 ENCOUNTER — Other Ambulatory Visit: Payer: Self-pay | Admitting: *Deleted

## 2012-12-07 MED ORDER — METOPROLOL TARTRATE 25 MG PO TABS: 25.0000 mg | ORAL_TABLET | Freq: Every day | ORAL | Status: DC

## 2013-01-14 ENCOUNTER — Ambulatory Visit (INDEPENDENT_AMBULATORY_CARE_PROVIDER_SITE_OTHER): Payer: Medicare Other | Admitting: Cardiovascular Disease

## 2013-01-14 VITALS — BP 160/49 | HR 77 | Ht 62.5 in | Wt 97.1 lb

## 2013-01-14 DIAGNOSIS — I5032 Chronic diastolic (congestive) heart failure: Secondary | ICD-10-CM

## 2013-01-14 DIAGNOSIS — R030 Elevated blood-pressure reading, without diagnosis of hypertension: Secondary | ICD-10-CM

## 2013-01-14 DIAGNOSIS — I2510 Atherosclerotic heart disease of native coronary artery without angina pectoris: Secondary | ICD-10-CM

## 2013-01-14 DIAGNOSIS — R7989 Other specified abnormal findings of blood chemistry: Secondary | ICD-10-CM

## 2013-01-14 NOTE — Progress Notes (Signed)
Patient ID: Kristen Rodriguez, female   DOB: Jan 11, 1924, 79 y.o.   MRN: [id number]      SUBJECTIVE: Kristen Rodriguez is here to f/u on her CAD. She is symptomatically stable, and denies chest pain, shortness of breath, leg swelling, and dizziness. She is here with her daughter. She lives alone. She has a hammertoe that bothers her and wants to get surgery, but both the surgeon and daughter apparently feel this is better served with conservative treatment.  She says her BP is usually normal at her PCP's office.    Allergies  Allergen Reactions  . Codeine Nausea Only  . Penicillins Other (See Comments)    unknown  . Sulfa Antibiotics Nausea Only    Current Outpatient Prescriptions  Medication Sig Dispense Refill  . amLODipine (NORVASC) 5 MG tablet Take 5 mg by mouth daily.      Marland Kitchen aspirin 81 MG tablet Take 81 mg by mouth daily.      . calcium-vitamin D (OSCAL WITH D) 500-200 MG-UNIT per tablet Take 1 tablet by mouth 2 (two) times daily.      . clopidogrel (PLAVIX) 75 MG tablet TAKE 1 TABLET BY MOUTH DAILY WITH BREAKFAST EMERGENCY REFILL FAXED DR  30 tablet  3  . docusate sodium (COLACE) 100 MG capsule Take 100 mg by mouth 2 (two) times daily.      Marland Kitchen HYDROcodone-acetaminophen (NORCO/VICODIN) 5-325 MG per tablet Take 1 tablet by mouth every 6 (six) hours as needed.      . metoprolol tartrate (LOPRESSOR) 25 MG tablet Take 1 tablet (25 mg total) by mouth daily.  60 tablet  1  . mirtazapine (REMERON) 15 MG tablet Take 15 mg by mouth at bedtime.      . Multiple Vitamin (MULTIVITAMIN) tablet Take 1 tablet by mouth daily.      . Multiple Vitamins-Minerals (PRESERVISION AREDS PO) Take 1 tablet by mouth daily.      . multivitamin-lutein (OCUVITE-LUTEIN) CAPS capsule Take 1 capsule by mouth daily.      . polyethylene glycol (MIRALAX / GLYCOLAX) packet Take 17 g by mouth daily.       No current facility-administered medications for this visit.    Past Medical History  Diagnosis Date  . Precordial pain     . Old myocardial infarction   . Hypopotassemia   . Esophageal reflux   . Allergic rhinitis, cause unspecified   . Pure hypercholesterolemia   . Unspecified essential hypertension   . Family history of ischemic heart disease   . CAD (coronary artery disease) 07/2000    Status Post NSTEMI, 60% mid RCA lesion treated medically  . Breast cancer 2006  . DVT (deep venous thrombosis) 2006  . Macular degeneration   . CHF (congestive heart failure)   . H/O hiatal hernia     Past Surgical History  Procedure Laterality Date  . Lumbar laminectomy    . Cataract extraction, bilateral    . Abdominal hysterectomy    . Corneal implant      left  . Cardiac catheterization  2002    Without PCI  . Cardiac catheterization  10/08/2011    1.  Inferior MI with occlusion of the PLA with reperfusion with POBA, stent placed proximally RCA  for high grade disease  . Total hip arthroplasty  02/2010    Right hip by Kinnie Scales Polmeritis    . Colonoscopy  09/17/2005    Tubular adenoma fragments  . Femur im nail  12/01/2011  Procedure: INTRAMEDULLARY (IM) NAIL FEMORAL;  Surgeon: Javier Docker, MD;  Location: [facility];  Service: Orthopedics;  Laterality: Left;  left hipintertrochanteric nail  (Biomet)    History   Social History  . Marital Status: Widowed    Spouse Name: N/A    Number of Children: N/A  . Years of Education: N/A   Occupational History  . RETIRED    Social History Main Topics  . Smoking status: Never Smoker   . Smokeless tobacco: Never Used  . Alcohol Use: No  . Drug Use: No  . Sexual Activity: No   Other Topics Concern  . Not on file   Social History Narrative  . No narrative on file     Filed Vitals:   01/14/13 1102  BP: 160/49  Pulse: 77  Height: 5' 2.5" (1.588 m)  Weight: 97 lb 1.9 oz (44.053 kg)    PHYSICAL EXAM General: NAD Neck: No JVD, no thyromegaly or thyroid nodule.  Lungs: Clear to auscultation bilaterally with normal respiratory effort. CV: Nondisplaced  PMI.  Heart regular S1/S2, no S3/S4, no murmur.  No peripheral edema.  No carotid bruit.  Normal pedal pulses.  Abdomen: Soft, nontender, no hepatosplenomegaly, no distention.  Neurologic: Alert and oriented x 3.  Psych: Normal affect. Extremities: No clubbing or cyanosis.   ECG: reviewed and available in electronic records.      ASSESSMENT AND PLAN: Transaminitis  In light of h/o elevated LFTs, Lipitor will not be initiated.   CAD  Quiescent on current medication regimen, which includes DAPT and metoprolol.  Chronic diastolic heart failure  Euvolemic by history and exam.  HTN As it is reportedly normotensive at her PCP's office, I will make no medication adjustments. However, if it were to remain persistently elevated, I would increase amlodipine to 10 mg daily.   Prentice Docker, M.D., F.A.C.C.

## 2013-01-14 NOTE — Patient Instructions (Addendum)

## 2013-05-03 ENCOUNTER — Other Ambulatory Visit: Payer: Self-pay | Admitting: Physician Assistant

## 2013-05-21 ENCOUNTER — Other Ambulatory Visit: Payer: Self-pay | Admitting: Adult Health

## 2013-05-30 ENCOUNTER — Other Ambulatory Visit: Payer: Self-pay | Admitting: Adult Health

## 2013-07-13 ENCOUNTER — Ambulatory Visit: Payer: Medicare Other | Admitting: Cardiovascular Disease

## 2013-07-14 ENCOUNTER — Ambulatory Visit: Payer: Medicare Other | Admitting: Cardiovascular Disease

## 2013-07-14 DEATH — deceased

## 2013-07-15 ENCOUNTER — Other Ambulatory Visit: Payer: Self-pay | Admitting: Cardiovascular Disease

## 2013-07-15 ENCOUNTER — Encounter: Payer: Self-pay | Admitting: Cardiovascular Disease

## 2013-07-15 ENCOUNTER — Ambulatory Visit (INDEPENDENT_AMBULATORY_CARE_PROVIDER_SITE_OTHER): Payer: 59 | Admitting: Cardiovascular Disease

## 2013-07-15 VITALS — BP 130/81 | HR 91 | Ht 62.0 in | Wt 89.0 lb

## 2013-07-15 DIAGNOSIS — R5383 Other fatigue: Secondary | ICD-10-CM

## 2013-07-15 DIAGNOSIS — I493 Ventricular premature depolarization: Secondary | ICD-10-CM

## 2013-07-15 DIAGNOSIS — I2510 Atherosclerotic heart disease of native coronary artery without angina pectoris: Secondary | ICD-10-CM

## 2013-07-15 DIAGNOSIS — R531 Weakness: Secondary | ICD-10-CM

## 2013-07-15 DIAGNOSIS — E782 Mixed hyperlipidemia: Secondary | ICD-10-CM

## 2013-07-15 DIAGNOSIS — I4949 Other premature depolarization: Secondary | ICD-10-CM

## 2013-07-15 DIAGNOSIS — R7402 Elevation of levels of lactic acid dehydrogenase (LDH): Secondary | ICD-10-CM

## 2013-07-15 DIAGNOSIS — R5381 Other malaise: Secondary | ICD-10-CM

## 2013-07-15 DIAGNOSIS — I5032 Chronic diastolic (congestive) heart failure: Secondary | ICD-10-CM

## 2013-07-15 DIAGNOSIS — R7401 Elevation of levels of liver transaminase levels: Secondary | ICD-10-CM

## 2013-07-15 DIAGNOSIS — R740 Nonspecific elevation of levels of transaminase and lactic acid dehydrogenase [LDH]: Secondary | ICD-10-CM

## 2013-07-15 NOTE — Progress Notes (Signed)
Patient ID: GIDGET QUIZHPI, female   DOB: 02/13/1924, 80 y.o.   MRN: [id number]      SUBJECTIVE: Mrs. Henrichs is here to f/u on her CAD. She is symptomatically stable, and denies chest pain, shortness of breath, leg swelling, and dizziness.  ECG today shows normal sinus rhythm, heart rate 91 beats per minute, with a diffuse nonspecific ST segment abnormality.  She is here with her daughter. She lives alone. She had a brother who passed away at the end of 2022/04/29. They went to his funeral and since that time, she has been feeling weak and fatigued. She denies chest pain, shortness of breath, palpitations, lightheadedness, dizziness, orthopnea, PND, leg swelling and syncope. She also denies fevers, chills, headaches, cough and rhinorrhea. She does have a history of falls due to loss of balance. She denies a history of depression. She had not been taking her medications and then took too many in the span of 2 days, and her daughter has now been providing her with her medications in a timely fashion.     Allergies  Allergen Reactions  . Codeine Nausea Only  . Penicillins Other (See Comments)    unknown  . Sulfa Antibiotics Nausea Only    Current Outpatient Prescriptions  Medication Sig Dispense Refill  . amLODipine (NORVASC) 5 MG tablet Take 5 mg by mouth daily.      Marland Kitchen aspirin 81 MG tablet Take 81 mg by mouth daily.      . calcium-vitamin D (OSCAL WITH D) 500-200 MG-UNIT per tablet Take 1 tablet by mouth 2 (two) times daily.      . clopidogrel (PLAVIX) 75 MG tablet TAKE 1 TABLET BY MOUTH DAILY WITH BREAKFAST  30 tablet  3  . docusate sodium (COLACE) 100 MG capsule Take 100 mg by mouth at bedtime.       Marland Kitchen HYDROcodone-acetaminophen (NORCO/VICODIN) 5-325 MG per tablet Take 1 tablet by mouth every 6 (six) hours as needed.      . metoprolol tartrate (LOPRESSOR) 25 MG tablet TAKE 1 TABLET BY MOUTH EVERY DAY EMERGENCY REFILL FAXED DR  30 tablet  6  . mirtazapine (REMERON) 15 MG tablet Take 15 mg by  mouth at bedtime.      . Multiple Vitamin (MULTIVITAMIN) tablet Take 1 tablet by mouth daily.      . Multiple Vitamins-Minerals (PRESERVISION AREDS PO) Take 1 tablet by mouth daily.      . multivitamin-lutein (OCUVITE-LUTEIN) CAPS capsule Take 1 capsule by mouth daily.      Marland Kitchen NITROSTAT 0.4 MG SL tablet PLACE 1 TABLET UNDER TONGUE EVERY 5 MINUTES AS NEEDED FOR RECURRENT CHEST PAIN -EMERGENCY REFILL  25 tablet  3  . polyethylene glycol (MIRALAX / GLYCOLAX) packet Take 17 g by mouth daily as needed.        No current facility-administered medications for this visit.    Past Medical History  Diagnosis Date  . Precordial pain   . Old myocardial infarction   . Hypopotassemia   . Esophageal reflux   . Allergic rhinitis, cause unspecified   . Pure hypercholesterolemia   . Unspecified essential hypertension   . Family history of ischemic heart disease   . CAD (coronary artery disease) 07/2000    Status Post NSTEMI, 60% mid RCA lesion treated medically  . Breast cancer 2006  . DVT (deep venous thrombosis) 2006  . Macular degeneration   . CHF (congestive heart failure)   . H/O hiatal hernia     Past Surgical  History  Procedure Laterality Date  . Lumbar laminectomy    . Cataract extraction, bilateral    . Abdominal hysterectomy    . Corneal implant      left  . Cardiac catheterization  2002    Without PCI  . Cardiac catheterization  10/08/2011    1.  Inferior MI with occlusion of the PLA with reperfusion with POBA, stent placed proximally RCA  for high grade disease  . Total hip arthroplasty  02/2010    Right hip by Nilda Calamity Polmeritis    . Colonoscopy  09/17/2005    Tubular adenoma fragments  . Femur im nail  12/01/2011    Procedure: INTRAMEDULLARY (IM) NAIL FEMORAL;  Surgeon: Johnn Hai, MD;  Location: [facility];  Service: Orthopedics;  Laterality: Left;  left hipintertrochanteric nail  (Biomet)    History   Social History  . Marital Status: Widowed    Spouse Name: N/A    Number  of Children: N/A  . Years of Education: N/A   Occupational History  . RETIRED    Social History Main Topics  . Smoking status: Never Smoker   . Smokeless tobacco: Never Used  . Alcohol Use: No  . Drug Use: No  . Sexual Activity: No   Other Topics Concern  . Not on file   Social History Narrative  . No narrative on file     Filed Vitals:   07/15/13 1428  BP: 130/81  Height: 5\' 2"  (1.575 m)  Weight: 89 lb (40.37 kg)    PHYSICAL EXAM General: NAD, elderly, frail Neck: No JVD, no thyromegaly. Lungs: Clear to auscultation bilaterally with normal respiratory effort. CV: Nondisplaced PMI.  Regular rate and rhythm, normal S1/S2, no S3/S4, no murmur. No pretibial or periankle edema.  No carotid bruit.  Normal pedal pulses.  Abdomen: Soft, nontender, no hepatosplenomegaly, no distention.  Neurologic: Alert and oriented x 3.  Psych: Normal affect. Extremities: No clubbing or cyanosis.   ECG: reviewed and available in electronic records.      ASSESSMENT AND PLAN:  Transaminitis  In light of h/o elevated LFTs, Lipitor will not be initiated.   CAD  Quiescent on current medication regimen, which includes DAPT and metoprolol.   Chronic diastolic heart failure  Euvolemic by history and exam.   HTN  Controlled on current regimen.  Weakness and fatigue I will obtain a BMET, CBC with differential, and TSH to rule out renal dysfunction, anemia, hyponatremia, and hypothyroidism. She is scheduled to follow up with her PCP this Monday, and I will have the results forwarded.  Dispo: f/u 6 months.  Kate Sable, M.D., F.A.C.C.

## 2013-07-15 NOTE — Patient Instructions (Signed)
Continue all current medications. Labs for BMET, TSH, CBC w/ diff Office will contact with results via phone or letter.   Your physician wants you to follow up in: 6 months.  You will receive a reminder letter in the mail one-two months in advance.  If you don't receive a letter, please call our office to schedule the follow up appointment

## 2013-07-16 ENCOUNTER — Telehealth: Payer: Self-pay | Admitting: *Deleted

## 2013-07-16 ENCOUNTER — Encounter (HOSPITAL_COMMUNITY): Payer: Self-pay | Admitting: Emergency Medicine

## 2013-07-16 ENCOUNTER — Emergency Department (HOSPITAL_COMMUNITY): Payer: Medicare Other

## 2013-07-16 ENCOUNTER — Inpatient Hospital Stay (HOSPITAL_COMMUNITY)
Admission: EM | Admit: 2013-07-16 | Discharge: 2013-07-21 | DRG: 871 | Disposition: A | Discharge status: Home Health | Payer: Medicare Other | Attending: Internal Medicine | Admitting: Internal Medicine

## 2013-07-16 DIAGNOSIS — E876 Hypokalemia: Secondary | ICD-10-CM | POA: Diagnosis present

## 2013-07-16 DIAGNOSIS — Z8249 Family history of ischemic heart disease and other diseases of the circulatory system: Secondary | ICD-10-CM

## 2013-07-16 DIAGNOSIS — Z7982 Long term (current) use of aspirin: Secondary | ICD-10-CM

## 2013-07-16 DIAGNOSIS — N390 Urinary tract infection, site not specified: Secondary | ICD-10-CM | POA: Diagnosis present

## 2013-07-16 DIAGNOSIS — R4182 Altered mental status, unspecified: Secondary | ICD-10-CM | POA: Diagnosis present

## 2013-07-16 DIAGNOSIS — D649 Anemia, unspecified: Secondary | ICD-10-CM | POA: Diagnosis present

## 2013-07-16 DIAGNOSIS — I252 Old myocardial infarction: Secondary | ICD-10-CM

## 2013-07-16 DIAGNOSIS — R532 Functional quadriplegia: Secondary | ICD-10-CM | POA: Diagnosis present

## 2013-07-16 DIAGNOSIS — A419 Sepsis, unspecified organism: Principal | ICD-10-CM | POA: Diagnosis present

## 2013-07-16 DIAGNOSIS — I82409 Acute embolism and thrombosis of unspecified deep veins of unspecified lower extremity: Secondary | ICD-10-CM

## 2013-07-16 DIAGNOSIS — E860 Dehydration: Secondary | ICD-10-CM | POA: Diagnosis present

## 2013-07-16 DIAGNOSIS — Z7902 Long term (current) use of antithrombotics/antiplatelets: Secondary | ICD-10-CM

## 2013-07-16 DIAGNOSIS — R627 Adult failure to thrive: Secondary | ICD-10-CM | POA: Diagnosis present

## 2013-07-16 DIAGNOSIS — R9430 Abnormal result of cardiovascular function study, unspecified: Secondary | ICD-10-CM | POA: Diagnosis present

## 2013-07-16 DIAGNOSIS — N179 Acute kidney failure, unspecified: Secondary | ICD-10-CM | POA: Diagnosis present

## 2013-07-16 DIAGNOSIS — I509 Heart failure, unspecified: Secondary | ICD-10-CM | POA: Diagnosis present

## 2013-07-16 DIAGNOSIS — H3530 Unspecified macular degeneration: Secondary | ICD-10-CM | POA: Diagnosis present

## 2013-07-16 DIAGNOSIS — I5042 Chronic combined systolic (congestive) and diastolic (congestive) heart failure: Secondary | ICD-10-CM | POA: Diagnosis present

## 2013-07-16 DIAGNOSIS — Z66 Do not resuscitate: Secondary | ICD-10-CM | POA: Diagnosis present

## 2013-07-16 DIAGNOSIS — Z86718 Personal history of other venous thrombosis and embolism: Secondary | ICD-10-CM

## 2013-07-16 DIAGNOSIS — G92 Toxic encephalopathy: Secondary | ICD-10-CM | POA: Diagnosis present

## 2013-07-16 DIAGNOSIS — I493 Ventricular premature depolarization: Secondary | ICD-10-CM | POA: Diagnosis present

## 2013-07-16 DIAGNOSIS — IMO0002 Reserved for concepts with insufficient information to code with codable children: Secondary | ICD-10-CM | POA: Diagnosis present

## 2013-07-16 DIAGNOSIS — I2510 Atherosclerotic heart disease of native coronary artery without angina pectoris: Secondary | ICD-10-CM | POA: Diagnosis present

## 2013-07-16 DIAGNOSIS — Z87440 Personal history of urinary (tract) infections: Secondary | ICD-10-CM

## 2013-07-16 DIAGNOSIS — Z96649 Presence of unspecified artificial hip joint: Secondary | ICD-10-CM

## 2013-07-16 DIAGNOSIS — Z681 Body mass index (BMI) 19 or less, adult: Secondary | ICD-10-CM

## 2013-07-16 DIAGNOSIS — Z87442 Personal history of urinary calculi: Secondary | ICD-10-CM

## 2013-07-16 DIAGNOSIS — I2129 ST elevation (STEMI) myocardial infarction involving other sites: Secondary | ICD-10-CM

## 2013-07-16 DIAGNOSIS — Z853 Personal history of malignant neoplasm of breast: Secondary | ICD-10-CM

## 2013-07-16 DIAGNOSIS — I272 Pulmonary hypertension, unspecified: Secondary | ICD-10-CM | POA: Diagnosis present

## 2013-07-16 DIAGNOSIS — D638 Anemia in other chronic diseases classified elsewhere: Secondary | ICD-10-CM | POA: Diagnosis present

## 2013-07-16 DIAGNOSIS — G929 Unspecified toxic encephalopathy: Secondary | ICD-10-CM | POA: Diagnosis present

## 2013-07-16 DIAGNOSIS — K219 Gastro-esophageal reflux disease without esophagitis: Secondary | ICD-10-CM | POA: Diagnosis present

## 2013-07-16 DIAGNOSIS — E7800 Pure hypercholesterolemia, unspecified: Secondary | ICD-10-CM | POA: Diagnosis present

## 2013-07-16 DIAGNOSIS — I2789 Other specified pulmonary heart diseases: Secondary | ICD-10-CM | POA: Diagnosis present

## 2013-07-16 DIAGNOSIS — E785 Hyperlipidemia, unspecified: Secondary | ICD-10-CM | POA: Diagnosis present

## 2013-07-16 DIAGNOSIS — Z7401 Bed confinement status: Secondary | ICD-10-CM

## 2013-07-16 DIAGNOSIS — I10 Essential (primary) hypertension: Secondary | ICD-10-CM | POA: Diagnosis present

## 2013-07-16 DIAGNOSIS — E41 Nutritional marasmus: Secondary | ICD-10-CM | POA: Diagnosis present

## 2013-07-16 LAB — CBC WITH DIFFERENTIAL/PLATELET
BASOS PCT: 0 % (ref 0–1)
Basophils Absolute: 0 10*3/uL (ref 0.0–0.1)
Basophils Absolute: 0 10*3/uL (ref 0.0–0.1)
Basophils Relative: 0 % (ref 0–1)
Eosinophils Absolute: 0 10*3/uL (ref 0.0–0.7)
Eosinophils Absolute: 0.1 10*3/uL (ref 0.0–0.7)
Eosinophils Relative: 0 % (ref 0–5)
Eosinophils Relative: 0 % (ref 0–5)
HCT: 31.2 % — ABNORMAL LOW (ref 36.0–46.0)
HEMATOCRIT: 35.1 % — AB (ref 36.0–46.0)
HEMOGLOBIN: 11.7 g/dL — AB (ref 12.0–15.0)
Hemoglobin: 10.7 g/dL — ABNORMAL LOW (ref 12.0–15.0)
LYMPHS ABS: 1.3 10*3/uL (ref 0.7–4.0)
Lymphocytes Relative: 9 % — ABNORMAL LOW (ref 12–46)
Lymphocytes Relative: 9 % — ABNORMAL LOW (ref 12–46)
Lymphs Abs: 2 10*3/uL (ref 0.7–4.0)
MCH: 31.4 pg (ref 26.0–34.0)
MCH: 32 pg (ref 26.0–34.0)
MCHC: 33.3 g/dL (ref 30.0–36.0)
MCHC: 34.3 g/dL (ref 30.0–36.0)
MCV: 94.1 fL (ref 78.0–100.0)
MCV: 93.4 fL (ref 78.0–100.0)
MONO ABS: 1.6 10*3/uL — AB (ref 0.1–1.0)
MONOS PCT: 7 % (ref 3–12)
MONOS PCT: 8 % (ref 3–12)
Monocytes Absolute: 1.1 10*3/uL — ABNORMAL HIGH (ref 0.1–1.0)
NEUTROS ABS: 19.1 10*3/uL — AB (ref 1.7–7.7)
NEUTROS PCT: 82 % — AB (ref 43–77)
Neutro Abs: 11.5 10*3/uL — ABNORMAL HIGH (ref 1.7–7.7)
Neutrophils Relative %: 84 % — ABNORMAL HIGH (ref 43–77)
Platelets: 408 10*3/uL — ABNORMAL HIGH (ref 150–400)
Platelets: 304 10*3/uL (ref 150–400)
RBC: 3.73 MIL/uL — ABNORMAL LOW (ref 3.87–5.11)
RBC: 3.34 MIL/uL — AB (ref 3.87–5.11)
RDW: 14.2 % (ref 11.5–15.5)
RDW: 13.9 % (ref 11.5–15.5)
WBC: 22.7 10*3/uL — ABNORMAL HIGH (ref 4.0–10.5)
WBC: 14 10*3/uL — ABNORMAL HIGH (ref 4.0–10.5)

## 2013-07-16 LAB — URINALYSIS, ROUTINE W REFLEX MICROSCOPIC
BILIRUBIN URINE: NEGATIVE
GLUCOSE, UA: NEGATIVE mg/dL
Ketones, ur: NEGATIVE mg/dL
NITRITE: NEGATIVE
PH: 7 (ref 5.0–8.0)
Protein, ur: 30 mg/dL — AB
SPECIFIC GRAVITY, URINE: 1.015 (ref 1.005–1.030)
Urobilinogen, UA: 0.2 mg/dL (ref 0.0–1.0)

## 2013-07-16 LAB — BASIC METABOLIC PANEL
BUN: 16 mg/dL (ref 6–23)
BUN: 16 mg/dL (ref 6–23)
CHLORIDE: 98 meq/L (ref 96–112)
CO2: 29 mEq/L (ref 19–32)
CO2: 29 mEq/L (ref 19–32)
Calcium: 9.8 mg/dL (ref 8.4–10.5)
Calcium: 9.7 mg/dL (ref 8.4–10.5)
Chloride: 92 mEq/L — ABNORMAL LOW (ref 96–112)
Creat: 1.51 mg/dL — ABNORMAL HIGH (ref 0.50–1.10)
Creatinine, Ser: 1.31 mg/dL — ABNORMAL HIGH (ref 0.50–1.10)
GFR calc Af Amer: 41 mL/min — ABNORMAL LOW (ref >90)
GFR, EST NON AFRICAN AMERICAN: 35 mL/min — AB (ref >90)
GLUCOSE: 255 mg/dL — AB (ref 70–99)
GLUCOSE: 125 mg/dL — AB (ref 70–99)
POTASSIUM: 2.8 meq/L — AB (ref 3.5–5.3)
Potassium: <2.2 mEq/L — CL (ref 3.7–5.3)
Sodium: 143 mEq/L (ref 135–145)
Sodium: 136 mEq/L — ABNORMAL LOW (ref 137–147)

## 2013-07-16 LAB — TROPONIN I: Troponin I: <0.3 ng/mL (ref <0.30)

## 2013-07-16 LAB — URINE MICROSCOPIC-ADD ON

## 2013-07-16 LAB — LACTIC ACID, PLASMA: LACTIC ACID, VENOUS: 1.5 mmol/L (ref 0.5–2.2)

## 2013-07-16 LAB — MAGNESIUM: Magnesium: 1.5 mg/dL (ref 1.5–2.5)

## 2013-07-16 LAB — PRO B NATRIURETIC PEPTIDE: Pro B Natriuretic peptide (BNP): 2370 pg/mL — ABNORMAL HIGH (ref 0–450)

## 2013-07-16 LAB — TSH: TSH: 1.337 u[IU]/mL (ref 0.350–4.500)

## 2013-07-16 MED ORDER — MAGNESIUM SULFATE 40 MG/ML IJ SOLN
2.0000 g | Freq: Once | INTRAMUSCULAR | Status: AC
  Administered 2013-07-16: 2 g via INTRAVENOUS
  Filled 2013-07-16: qty 50

## 2013-07-16 MED ORDER — POTASSIUM CHLORIDE 20 MEQ/15ML (10%) PO LIQD
40.0000 meq | Freq: Once | ORAL | Status: AC
  Administered 2013-07-16: 40 meq via ORAL
  Filled 2013-07-16: qty 30

## 2013-07-16 MED ORDER — ENOXAPARIN SODIUM 30 MG/0.3ML ~~LOC~~ SOLN
30.0000 mg | SUBCUTANEOUS | Status: DC
  Administered 2013-07-16 – 2013-07-20 (×5): 30 mg via SUBCUTANEOUS
  Filled 2013-07-16 (×5): qty 0.3

## 2013-07-16 MED ORDER — HYDROCODONE-ACETAMINOPHEN 5-325 MG PO TABS
1.0000 | ORAL_TABLET | Freq: Four times a day (QID) | ORAL | Status: DC | PRN
  Administered 2013-07-19 – 2013-07-20 (×3): 1 via ORAL
  Filled 2013-07-16 (×3): qty 1

## 2013-07-16 MED ORDER — DOCUSATE SODIUM 100 MG PO CAPS
100.0000 mg | ORAL_CAPSULE | Freq: Every day | ORAL | Status: DC
  Administered 2013-07-16 – 2013-07-20 (×5): 100 mg via ORAL
  Filled 2013-07-16 (×7): qty 1

## 2013-07-16 MED ORDER — SODIUM CHLORIDE 0.9 % IV SOLN: INTRAVENOUS | Status: DC

## 2013-07-16 MED ORDER — MIRTAZAPINE 30 MG PO TABS
15.0000 mg | ORAL_TABLET | Freq: Every day | ORAL | Status: DC
  Administered 2013-07-16 – 2013-07-20 (×5): 15 mg via ORAL
  Filled 2013-07-16 (×2): qty 0.5
  Filled 2013-07-16 (×5): qty 1

## 2013-07-16 MED ORDER — METOPROLOL TARTRATE 25 MG PO TABS
25.0000 mg | ORAL_TABLET | Freq: Every morning | ORAL | Status: DC
  Administered 2013-07-17 – 2013-07-21 (×5): 25 mg via ORAL
  Filled 2013-07-16 (×5): qty 1

## 2013-07-16 MED ORDER — CLOPIDOGREL BISULFATE 75 MG PO TABS
75.0000 mg | ORAL_TABLET | Freq: Every morning | ORAL | Status: DC
  Administered 2013-07-17 – 2013-07-21 (×5): 75 mg via ORAL
  Filled 2013-07-16 (×5): qty 1

## 2013-07-16 MED ORDER — AMLODIPINE BESYLATE 5 MG PO TABS
5.0000 mg | ORAL_TABLET | Freq: Every morning | ORAL | Status: DC
  Administered 2013-07-17 – 2013-07-21 (×5): 5 mg via ORAL
  Filled 2013-07-16 (×5): qty 1

## 2013-07-16 MED ORDER — ASPIRIN EC 81 MG PO TBEC
81.0000 mg | DELAYED_RELEASE_TABLET | Freq: Every day | ORAL | Status: DC
  Administered 2013-07-16 – 2013-07-21 (×6): 81 mg via ORAL
  Filled 2013-07-16 (×9): qty 1

## 2013-07-16 MED ORDER — POTASSIUM CHLORIDE 10 MEQ/100ML IV SOLN
10.0000 meq | Freq: Once | INTRAVENOUS | Status: AC
  Administered 2013-07-16: 10 meq via INTRAVENOUS
  Filled 2013-07-16: qty 100

## 2013-07-16 MED ORDER — DEXTROSE 5 % IV SOLN
1.0000 g | Freq: Once | INTRAVENOUS | Status: AC
  Administered 2013-07-16: 1 g via INTRAVENOUS
  Filled 2013-07-16: qty 10

## 2013-07-16 MED ORDER — SODIUM CHLORIDE 0.9 % IV SOLN
INTRAVENOUS | Status: DC
  Administered 2013-07-16 (×2): via INTRAVENOUS

## 2013-07-16 MED ORDER — NITROGLYCERIN 0.4 MG SL SUBL: 0.4000 mg | SUBLINGUAL_TABLET | SUBLINGUAL | Status: DC | PRN

## 2013-07-16 MED ORDER — ENOXAPARIN SODIUM 40 MG/0.4ML ~~LOC~~ SOLN: 40.0000 mg | SUBCUTANEOUS | Status: DC

## 2013-07-16 NOTE — ED Notes (Signed)
BP was 120/60 upon standing. Patient's family member states that she does not do a lot of moving while home and becomes tired with any  Type of physical exertion. Asked if she was in any pain she states no at this time. She grunts and groans upon moving.

## 2013-07-16 NOTE — ED Notes (Signed)
MD at bedside. 

## 2013-07-16 NOTE — ED Notes (Signed)
CRITICAL VALUE ALERT  Critical value received:  K <2.2  Date of notification:  07-16-13  Time of notification:  1943  Critical value read back:yes  Nurse who received alert:  B. Olena Heckle  MD notified (1st page):  Thurnell Garbe  Time of first page:  1943  MD notified (2nd page):  Time of second page:  Responding MD:  Thurnell Garbe  Time MD responded:  [phone]

## 2013-07-16 NOTE — ED Notes (Signed)
Pt sent from PCP for elevated WBC and low Potassium.

## 2013-07-16 NOTE — Telephone Encounter (Signed)
Message copied by Laurine Blazer on Fri Jul 16, 2013  5:23 PM ------      Message from: Kate Sable A      Created: Fri Jul 16, 2013  8:20 AM       Markedly hypokalemic. Please forward to PCP asap. Have her start 40 meq KCl bid today. Repeat BMET on 4/6. PCP can decide if pt should go to ER for IV potassium. ------

## 2013-07-16 NOTE — ED Provider Notes (Signed)
CSN: [id number]     Arrival date & time 07/16/13  1833 History   First MD Initiated Contact with Patient 07/16/13 1852     Chief Complaint  Patient presents with  . Abnormal Lab      HPI Pt was seen at 1900. Per pt and her family, c/o gradual onset and persistence of constant generalized weakness for the past several weeks. Pt was evaluated by her Cards MD yesterday (routine visit) and had labs drawn. Pt was called today and told her "WBC count was high and her potassium level was low." Pt denies any specific complaint. Denies N/V/D, no abd pain, no back pain, no CP/SOB, no fevers, no rash.    Past Medical History  Diagnosis Date  . Precordial pain   . Old myocardial infarction   . Hypopotassemia   . Esophageal reflux   . Allergic rhinitis, cause unspecified   . Pure hypercholesterolemia   . Unspecified essential hypertension   . Family history of ischemic heart disease   . CAD (coronary artery disease) 07/2000    Status Post NSTEMI, 60% mid RCA lesion treated medically  . Breast cancer 2006  . DVT (deep venous thrombosis) 2006  . Macular degeneration   . CHF (congestive heart failure)   . H/O hiatal hernia    Past Surgical History  Procedure Laterality Date  . Lumbar laminectomy    . Cataract extraction, bilateral    . Abdominal hysterectomy    . Corneal implant      left  . Cardiac catheterization  2002    Without PCI  . Cardiac catheterization  10/08/2011    1.  Inferior MI with occlusion of the PLA with reperfusion with POBA, stent placed proximally RCA  for high grade disease  . Total hip arthroplasty  02/2010    Right hip by Nilda Calamity Polmeritis    . Colonoscopy  09/17/2005    Tubular adenoma fragments  . Femur im nail  12/01/2011    Procedure: INTRAMEDULLARY (IM) NAIL FEMORAL;  Surgeon: Johnn Hai, MD;  Location: [facility];  Service: Orthopedics;  Laterality: Left;  left hipintertrochanteric nail  (Biomet)   Family History  Problem Relation Age of Onset  . Heart  attack Father 74    Fatal  . Cancer     History  Substance Use Topics  . Smoking status: Never Smoker   . Smokeless tobacco: Never Used  . Alcohol Use: No    Review of Systems ROS: Statement: All systems negative except as marked or noted in the HPI; Constitutional: Negative for fever and chills. +generalized weakness/fatigue.; ; Eyes: Negative for eye pain, redness and discharge. ; ; ENMT: Negative for ear pain, hoarseness, nasal congestion, sinus pressure and sore throat. ; ; Cardiovascular: Negative for chest pain, palpitations, diaphoresis, dyspnea and peripheral edema. ; ; Respiratory: Negative for cough, wheezing and stridor. ; ; Gastrointestinal: Negative for nausea, vomiting, diarrhea, abdominal pain, blood in stool, hematemesis, jaundice and rectal bleeding. . ; ; Genitourinary: Negative for dysuria, flank pain and hematuria. ; ; Musculoskeletal: Negative for back pain and neck pain. Negative for swelling and trauma.; ; Skin: Negative for pruritus, rash, abrasions, blisters, bruising and skin lesion.; ; Neuro: Negative for headache, lightheadedness and neck stiffness. Negative for altered level of consciousness , altered mental status, extremity weakness, paresthesias, involuntary movement, seizure and syncope.      Allergies  Codeine; Penicillins; and Sulfa antibiotics  Home Medications   Current Outpatient Rx  Name  Route  Sig  Dispense  Refill  . clopidogrel (PLAVIX) 75 MG tablet   Oral   Take 75 mg by mouth daily with breakfast.         . metoprolol tartrate (LOPRESSOR) 25 MG tablet   Oral   Take 25 mg by mouth daily.         . nitroGLYCERIN (NITROSTAT) 0.4 MG SL tablet   Sublingual   Place 0.4 mg under the tongue every 5 (five) minutes as needed for chest pain.         Marland Kitchen amLODipine (NORVASC) 5 MG tablet   Oral   Take 5 mg by mouth daily.         Marland Kitchen aspirin 81 MG tablet   Oral   Take 81 mg by mouth daily.         . calcium-vitamin D (OSCAL WITH D)  500-200 MG-UNIT per tablet   Oral   Take 1 tablet by mouth 2 (two) times daily.         Marland Kitchen docusate sodium (COLACE) 100 MG capsule   Oral   Take 100 mg by mouth at bedtime.          Marland Kitchen HYDROcodone-acetaminophen (NORCO/VICODIN) 5-325 MG per tablet   Oral   Take 1 tablet by mouth every 6 (six) hours as needed.         . mirtazapine (REMERON) 15 MG tablet   Oral   Take 15 mg by mouth at bedtime.         . Multiple Vitamins-Minerals (PRESERVISION AREDS PO)   Oral   Take 1 tablet by mouth daily.         . multivitamin-lutein (OCUVITE-LUTEIN) CAPS capsule   Oral   Take 1 capsule by mouth daily.          BP 140/52  Pulse 80  Temp(Src) 99.3 F (37.4 C)  Resp 20  Ht 5' (1.524 m)  Wt 89 lb (40.37 kg)  BMI 17.38 kg/m2  SpO2 97% Physical Exam 1905: Physical examination:  Nursing notes reviewed; Vital signs and O2 SAT reviewed;  Constitutional: Well developed, Well nourished, In no acute distress; Head:  Normocephalic, atraumatic; Eyes: EOMI, PERRL, No scleral icterus; ENMT: Mouth and pharynx normal, Mucous membranes dry; Neck: Supple, Full range of motion, No lymphadenopathy; Cardiovascular: Regular rate and rhythm, No gallop; Respiratory: Breath sounds clear & equal bilaterally, No wheezes.  Speaking full sentences with ease, Normal respiratory effort/excursion; Chest: Nontender, Movement normal; Abdomen: Soft, Nontender, Nondistended, Normal bowel sounds; Genitourinary: No CVA tenderness; Extremities: Pulses normal, No tenderness, No edema, No calf edema or asymmetry.; Neuro: AA&Ox3, Major CN grossly intact.  Speech clear. No gross focal motor or sensory deficits in extremities.; Skin: Color normal, Warm, Dry.   ED Course  Procedures     EKG Interpretation   Date/Time:  Friday July 16 2013 19:37:09 EDT Ventricular Rate:  83 PR Interval:  172 QRS Duration: 86 QT Interval:  374 QTC Calculation: 439 R Axis:   16 Text Interpretation:  Normal sinus rhythm Artifact  Nonspecific ST  abnormality Abnormal ECG When compared with ECG of 11/30/2011 Nonspecific  ST and T wave abnormality are now Present Confirmed by [facility]  MD,  Nunzio Cory [phone]) on 07/16/2013 7:55:29 PM      MDM  MDM Reviewed: previous chart, nursing note and vitals Reviewed previous: labs and ECG Interpretation: labs, ECG and x-ray Total time providing critical care: 30-74 minutes. This excludes time spent performing separately reportable procedures and services. Consults: admitting  MD    CRITICAL CARE Performed by: Alfonzo Feller Total critical care time: 35 Critical care time was exclusive of separately billable procedures and treating other patients. Critical care was necessary to treat or prevent imminent or life-threatening deterioration. Critical care was time spent personally by me on the following activities: development of treatment plan with patient and/or surrogate as well as nursing, discussions with consultants, evaluation of patient's response to treatment, examination of patient, obtaining history from patient or surrogate, ordering and performing treatments and interventions, ordering and review of laboratory studies, ordering and review of radiographic studies, pulse oximetry and re-evaluation of patient's condition.  Results for orders placed during the hospital encounter of 07/16/13  URINALYSIS, ROUTINE W REFLEX MICROSCOPIC      Result Value Ref Range   Color, Urine YELLOW  YELLOW   APPearance CLEAR  CLEAR   Specific Gravity, Urine 1.015  1.005 - 1.030   pH 7.0  5.0 - 8.0   Glucose, UA NEGATIVE  NEGATIVE mg/dL   Hgb urine dipstick MODERATE (*) NEGATIVE   Bilirubin Urine NEGATIVE  NEGATIVE   Ketones, ur NEGATIVE  NEGATIVE mg/dL   Protein, ur 30 (*) NEGATIVE mg/dL   Urobilinogen, UA 0.2  0.0 - 1.0 mg/dL   Nitrite NEGATIVE  NEGATIVE   Leukocytes, UA MODERATE (*) NEGATIVE  CBC WITH DIFFERENTIAL      Result Value Ref Range   WBC 14.0 (*) 4.0 - 10.5 K/uL   RBC  3.34 (*) 3.87 - 5.11 MIL/uL   Hemoglobin 10.7 (*) 12.0 - 15.0 g/dL   HCT 31.2 (*) 36.0 - 46.0 %   MCV 93.4  78.0 - 100.0 fL   MCH 32.0  26.0 - 34.0 pg   MCHC 34.3  30.0 - 36.0 g/dL   RDW 13.9  11.5 - 15.5 %   Platelets 304  150 - 400 K/uL   Neutrophils Relative % 82 (*) 43 - 77 %   Neutro Abs 11.5 (*) 1.7 - 7.7 K/uL   Lymphocytes Relative 9 (*) 12 - 46 %   Lymphs Abs 1.3  0.7 - 4.0 K/uL   Monocytes Relative 8  3 - 12 %   Monocytes Absolute 1.1 (*) 0.1 - 1.0 K/uL   Eosinophils Relative 0  0 - 5 %   Eosinophils Absolute 0.1  0.0 - 0.7 K/uL   Basophils Relative 0  0 - 1 %   Basophils Absolute 0.0  0.0 - 0.1 K/uL  BASIC METABOLIC PANEL      Result Value Ref Range   Sodium 136 (*) 137 - 147 mEq/L   Potassium <2.2 (*) 3.7 - 5.3 mEq/L   Chloride 92 (*) 96 - 112 mEq/L   CO2 29  19 - 32 mEq/L   Glucose, Bld 125 (*) 70 - 99 mg/dL   BUN 16  6 - 23 mg/dL   Creatinine, Ser 1.31 (*) 0.50 - 1.10 mg/dL   Calcium 9.7  8.4 - 10.5 mg/dL   GFR calc non Af Amer 35 (*) >90 mL/min   GFR calc Af Amer 41 (*) >90 mL/min  TROPONIN I      Result Value Ref Range   Troponin I <0.30  <0.30 ng/mL  URINE MICROSCOPIC-ADD ON      Result Value Ref Range   Squamous Epithelial / LPF RARE  RARE   WBC, UA TOO NUMEROUS TO COUNT  <3 WBC/hpf   RBC / HPF 7-10  <3 RBC/hpf   Bacteria, UA MANY (*) RARE  Dg Chest Port 1 View 07/16/2013   CLINICAL DATA:  Elevated WBC  EXAM: PORTABLE CHEST - 1 VIEW  COMPARISON:  11/30/2011  FINDINGS: Cardiomediastinal silhouette is stable. No acute infiltrate or pleural effusion. No pulmonary edema. Degenerative changes thoracic spine.  IMPRESSION: No active disease.   Electronically Signed   By: Lahoma Crocker M.D.   On: 07/16/2013 19:32    2000:  H/H per baseline. Potassium significantly low; repleted IV and PO. Magnesium level added to labs. +UTI, UC pending. Will tx with IV rocephin. Dx and testing d/w pt and family.  Questions answered.  Verb understanding, agreeable to admit.  T/C to  [facility]. Sherral Hammers, case discussed, including:  HPI, pertinent PM/SHx, VS/PE, dx testing, ED course and treatment:  Agreeable to admit, requests to write temporary orders, obtain BCx2, obtain tele bed.     Alfonzo Feller, DO 07/19/13 1302

## 2013-07-16 NOTE — Telephone Encounter (Signed)
Notes Recorded by Laurine Blazer, LPN on 3/0/1601 at 0:93 PM Daughter [name]) notified. States she will take her this evening. Call placed to [facility] ED, spoke with Joycelyn Schmid Control and instrumentation engineer) to inform. Stated she will let charge nurse know.   Notes Recorded by Laurine Blazer, LPN on 05/19/5571 at 22:02 PM PMD office closed today. Will advise to be evaluated at Emergency Dept at [facility] Dr. Bronson Ing agreed with plan.   Notes Recorded by Laurine Blazer, LPN on 08/16/2704 at 23:76 PM Left message to return call at home number of daughter. Unable to leave message on voice mail. =============================================================================  Notes Recorded by Herminio Commons, MD on 07/16/2013 at 8:21 AM Pt has a markedly elevated WBC count. Have her see PCP today, rather than this upcoming Monday. She will need blood cultures, UA/UC, chest xray.

## 2013-07-16 NOTE — H&P (Signed)
[facility] History and Physical  Kristen Rodriguez NTI:[id number] DOB: 1924-01-28 DOA: 07/16/2013  Referring physician:     PCP: Octavio Graves, DO  Specialists:   Chief Complaint: Urosepsis  HPI: Kristen Rodriguez is a 80 y.o. WF PMHx  Hx left breast cancer diagnosed 2004, S/P lumpectomy (negative XRT/chemotherapy), chronic pain, PVCs, chronic systolic and diastolic CHF, pulmonary hypertension, CAD, Hx posterior wall MI 10/2010, anemia, HLD, chronic right nephrolithiasis with frequent UTI. Presented to ED mental status change and fatigue. Urinalysis shows patient with UTI initial WBC= 14,000. Per daughter mental status change with increasing fatigue   Review of Systems: The patient denies anorexia, weight loss,, vision loss, decreased hearing, hoarseness, chest pain, syncope, dyspnea on exertion, peripheral edema, hemoptysis, Abdominal pain, melena, hematochezia, severe indigestion/heartburn, hematuria, incontinence, genital sores, muscle weakness, suspicious skin lesions, transient blindness, depression, unusual weight change, abnormal bleeding, enlarged lymph nodes, angioedema, and breast masses.    TRAVEL HISTORY: None   Procedure Echocardiogram 10/09/2011  Left ventricle: mild LVH. LVEF= 40% to 45%. There is mild hypokinesis of the entireinferior myocardium.  - (grade 1 diastolic dysfunction). - Aortic valve: Trivial regurgitation. - Pulmonary arteries: PA peak pressure: 36mm Hg (S).   Antibiotics Ceftriaxone 4/3>>   Consultation    Past Medical History  Diagnosis Date  . Precordial pain   . Old myocardial infarction   . Hypopotassemia   . Esophageal reflux   . Allergic rhinitis, cause unspecified   . Pure hypercholesterolemia   . Unspecified essential hypertension   . Family history of ischemic heart disease   . CAD (coronary artery disease) 07/2000    Status Post NSTEMI, 60% mid RCA lesion treated medically  . Breast cancer 2006  . DVT (deep venous thrombosis) 2006  .  Macular degeneration   . CHF (congestive heart failure)   . H/O hiatal hernia    Past Surgical History  Procedure Laterality Date  . Lumbar laminectomy    . Cataract extraction, bilateral    . Abdominal hysterectomy    . Corneal implant      left  . Cardiac catheterization  2002    Without PCI  . Cardiac catheterization  10/08/2011    1.  Inferior MI with occlusion of the PLA with reperfusion with POBA, stent placed proximally RCA  for high grade disease  . Total hip arthroplasty  02/2010    Right hip by Nilda Calamity Polmeritis    . Colonoscopy  09/17/2005    Tubular adenoma fragments  . Femur im nail  12/01/2011    Procedure: INTRAMEDULLARY (IM) NAIL FEMORAL;  Surgeon: Johnn Hai, MD;  Location: [facility];  Service: Orthopedics;  Laterality: Left;  left hipintertrochanteric nail  ([employer]omet)   Social History:  reports that she has never smoked. She has never used smokeless tobacco. She reports that she does not drink alcohol or use illicit drugs.  Allergies  Allergen Reactions  . Codeine Nausea Only  . Penicillins Other (See Comments)    unknown  . Sulfa Antibiotics Nausea Only    Family History  Problem Relation Age of Onset  . Heart attack Father 81    Fatal  . Cancer       Prior to Admission medications   Medication Sig Start Date End Date Taking? Authorizing Provider  amLODipine (NORVASC) 5 MG tablet Take 5 mg by mouth daily.    Historical Provider, MD  aspirin 81 MG tablet Take 81 mg by mouth daily.    Historical Provider, MD  calcium-vitamin D (OSCAL WITH D) 500-200 MG-UNIT per tablet Take 1 tablet by mouth 2 (two) times daily.    Historical Provider, MD  clopidogrel (PLAVIX) 75 MG tablet TAKE 1 TABLET BY MOUTH DAILY WITH BREAKFAST 05/03/13   Herminio Commons, MD  docusate sodium (COLACE) 100 MG capsule Take 100 mg by mouth at bedtime.     Historical Provider, MD  HYDROcodone-acetaminophen (NORCO/VICODIN) 5-325 MG per tablet Take 1 tablet by mouth every 6 (six) hours as  needed.    Historical Provider, MD  metoprolol tartrate (LOPRESSOR) 25 MG tablet TAKE 1 TABLET BY MOUTH EVERY DAY EMERGENCY REFILL FAXED DR 05/21/13   Herminio Commons, MD  mirtazapine (REMERON) 15 MG tablet Take 15 mg by mouth at bedtime.    Historical Provider, MD  Multiple Vitamin (MULTIVITAMIN) tablet Take 1 tablet by mouth daily.    Historical Provider, MD  Multiple Vitamins-Minerals (PRESERVISION AREDS PO) Take 1 tablet by mouth daily.    Historical Provider, MD  multivitamin-lutein (OCUVITE-LUTEIN) CAPS capsule Take 1 capsule by mouth daily.    Historical Provider, MD  NITROSTAT 0.4 MG SL tablet PLACE 1 TABLET UNDER TONGUE EVERY 5 MINUTES AS NEEDED FOR RECURRENT CHEST PAIN -EMERGENCY REFILL    Satira Sark, MD  polyethylene glycol (MIRALAX / GLYCOLAX) packet Take 17 g by mouth daily as needed.     Historical Provider, MD   Physical Exam: Filed Vitals:   07/16/13 1941 07/16/13 1944 07/16/13 1945 07/16/13 2000  BP: 145/70 159/57 120/60 140/52  Pulse: 83 87 85 80  Temp:      Resp:      Height:      Weight:      SpO2:   100% 97%     General:  A./O. x3 (does not know when), NAD  Eyes: Pupils equal reactive to light and accommodation  Neck: Negative JVD, negative lymphadenopathy  Cardiovascular: Regular rhythm and rate, negative murmurs rubs or  Respiratory: Clear to auscultation bilateral  Abdomen: Soft, nontender, nondistended plus bowel sound; right CVA tenderness  Musculoskeletal: Negative pedal edema  Neurologic: Cranial nerves II through XII intact, extremity strength 5/5, sensation intact throughout  Labs on Admission:  Basic Metabolic Panel:  Recent Labs Lab 07/15/13 1537 07/16/13 1905 07/16/13 1949  NA 143 136*  --   K 2.8* <2.2*  --   CL 98 92*  --   CO2 29 29  --   GLUCOSE 255* 125*  --   BUN 16 16  --   CREATININE 1.51* 1.31*  --   CALCIUM 9.8 9.7  --   MG  --   --  1.5   Liver Function Tests: No results found for this basename: AST, ALT,  ALKPHOS, BILITOT, PROT, ALBUMIN,  in the last 168 hours No results found for this basename: LIPASE, AMYLASE,  in the last 168 hours No results found for this basename: AMMONIA,  in the last 168 hours CBC:  Recent Labs Lab 07/15/13 1537 07/16/13 1905  WBC 22.7* 14.0*  NEUTROABS 19.1* 11.5*  HGB 11.7* 10.7*  HCT 35.1* 31.2*  MCV 94.1 93.4  PLT 408* 304   Cardiac Enzymes:  Recent Labs Lab 07/16/13 1920 07/16/13 2016  TROPONINI <0.30 <0.30    BNP (last 3 results)  Recent Labs  07/16/13 2016  PROBNP 2370.0*   CBG: No results found for this basename: GLUCAP,  in the last 168 hours  Radiological Exams on Admission: Dg Chest Port 1 View  07/16/2013   CLINICAL  DATA:  Elevated WBC  EXAM: PORTABLE CHEST - 1 VIEW  COMPARISON:  11/30/2011  FINDINGS: Cardiomediastinal silhouette is stable. No acute infiltrate or pleural effusion. No pulmonary edema. Degenerative changes thoracic spine.  IMPRESSION: No active disease.   Electronically Signed   By: Lahoma Crocker M.D.   On: 07/16/2013 19:32    EKG: Independently reviewed. NSR, nonspecific ST-T wave changes diffusely cannot rule out ischemia; consistent with EKG from 4/2 at cardiologists office   Assessment/Plan Principal Problem:   UTI (lower urinary tract infection) Active Problems:   CAD   MURAL THROMBUS   Deep venous thrombosis   Acute MI, true posterior wall   PVCs (premature ventricular contractions)   Anemia   Ejection fraction < 50%   HLD (hyperlipidemia)   Hypokalemia   Chronic combined systolic and diastolic CHF (congestive heart failure)   Pulmonary hypertension   Mental status change   UTI/urosepsis -Continue patient on ceftriaxone until urine culture returns -Obtain blood culture x2 -Gently hydrate 75 ml/hr   mental status change -Most likely secondary to UTI/urosepsis  Chronic combined systolic/diastolic CHF -Place on telemetry -Obtain troponin x3 -07/16/2013  BNP= 2370 -Continue metoprolol 25 mg  daily -Continue amlodipine 5 mg daily -Continue aspirin 81 mg daily -Continue Plavix 75 mg daily  Pulmonary hypertension -See chronic combined systolic/diastolic CHF  Hypokalemia -Check potassium every 4 hour and replete; potassium goal>4 meq/L -Check magnesium every 4 hour and replete; magnesium goal > 2mg /dL  Hypomagnesemia -See hypokalemia  HLD -Obtain lipid panel  DVT -Per daughter patient was on anti-coagulation for a brief amount of time and has not been on since.        Code Status: DNR  Family Communication: Daughter at bedside Disposition Plan: Resolution UTI/urosepsis  Time spent: 73 minutes  Allie Bossier [facility] Pager [phone]  If 7PM-7AM, please contact night-coverage [email or web address] Password [facility] 07/16/2013, 9:36 PM

## 2013-07-17 ENCOUNTER — Observation Stay (HOSPITAL_COMMUNITY): Payer: Medicare Other

## 2013-07-17 LAB — CBC WITH DIFFERENTIAL/PLATELET
Basophils Absolute: 0 10*3/uL (ref 0.0–0.1)
Basophils Relative: 0 % (ref 0–1)
Eosinophils Absolute: 0 10*3/uL (ref 0.0–0.7)
Eosinophils Relative: 0 % (ref 0–5)
HCT: 29.1 % — ABNORMAL LOW (ref 36.0–46.0)
HEMOGLOBIN: 9.9 g/dL — AB (ref 12.0–15.0)
LYMPHS ABS: 1.7 10*3/uL (ref 0.7–4.0)
LYMPHS PCT: 18 % (ref 12–46)
MCH: 31.7 pg (ref 26.0–34.0)
MCHC: 34 g/dL (ref 30.0–36.0)
MCV: 93.3 fL (ref 78.0–100.0)
MONOS PCT: 10 % (ref 3–12)
Monocytes Absolute: 1 10*3/uL (ref 0.1–1.0)
NEUTROS ABS: 7.1 10*3/uL (ref 1.7–7.7)
NEUTROS PCT: 72 % (ref 43–77)
Platelets: 265 10*3/uL (ref 150–400)
RBC: 3.12 MIL/uL — AB (ref 3.87–5.11)
RDW: 14 % (ref 11.5–15.5)
WBC: 10 10*3/uL (ref 4.0–10.5)

## 2013-07-17 LAB — LIPID PANEL
CHOL/HDL RATIO: 2.1 ratio
Cholesterol: 161 mg/dL (ref 0–200)
HDL: 75 mg/dL (ref >39)
LDL CALC: 64 mg/dL (ref 0–99)
TRIGLYCERIDES: 108 mg/dL (ref <150)
VLDL: 22 mg/dL (ref 0–40)

## 2013-07-17 LAB — COMPREHENSIVE METABOLIC PANEL
ALT: 11 U/L (ref 0–35)
AST: 19 U/L (ref 0–37)
Albumin: 2.4 g/dL — ABNORMAL LOW (ref 3.5–5.2)
Alkaline Phosphatase: 100 U/L (ref 39–117)
BUN: 11 mg/dL (ref 6–23)
CALCIUM: 9 mg/dL (ref 8.4–10.5)
CO2: 29 mEq/L (ref 19–32)
Chloride: 94 mEq/L — ABNORMAL LOW (ref 96–112)
Creatinine, Ser: 1.05 mg/dL (ref 0.50–1.10)
GFR calc non Af Amer: 46 mL/min — ABNORMAL LOW (ref >90)
GFR, EST AFRICAN AMERICAN: 54 mL/min — AB (ref >90)
Glucose, Bld: 105 mg/dL — ABNORMAL HIGH (ref 70–99)
Potassium: <2.2 mEq/L — CL (ref 3.7–5.3)
SODIUM: 136 meq/L — AB (ref 137–147)
TOTAL PROTEIN: 5.8 g/dL — AB (ref 6.0–8.3)
Total Bilirubin: 0.5 mg/dL (ref 0.3–1.2)

## 2013-07-17 LAB — TROPONIN I: Troponin I: <0.3 ng/mL (ref <0.30)

## 2013-07-17 LAB — MAGNESIUM
Magnesium: 2.1 mg/dL (ref 1.5–2.5)
Magnesium: 1.9 mg/dL (ref 1.5–2.5)

## 2013-07-17 LAB — POTASSIUM: Potassium: 3.7 mEq/L (ref 3.7–5.3)

## 2013-07-17 MED ORDER — POTASSIUM CHLORIDE 10 MEQ/100ML IV SOLN
10.0000 meq | INTRAVENOUS | Status: AC
  Administered 2013-07-17 (×6): 10 meq via INTRAVENOUS
  Filled 2013-07-17 (×6): qty 100

## 2013-07-17 MED ORDER — ATORVASTATIN CALCIUM 10 MG PO TABS
10.0000 mg | ORAL_TABLET | Freq: Every day | ORAL | Status: DC
  Administered 2013-07-17 – 2013-07-21 (×5): 10 mg via ORAL
  Filled 2013-07-17 (×5): qty 1

## 2013-07-17 MED ORDER — POTASSIUM CHLORIDE 20 MEQ/15ML (10%) PO LIQD: 40.0000 meq | ORAL | Status: DC

## 2013-07-17 MED ORDER — POTASSIUM CHLORIDE CRYS ER 20 MEQ PO TBCR
40.0000 meq | EXTENDED_RELEASE_TABLET | ORAL | Status: DC
  Administered 2013-07-17 (×2): 40 meq via ORAL
  Filled 2013-07-17 (×2): qty 2

## 2013-07-17 MED ORDER — SODIUM CHLORIDE 0.9 % IV SOLN
INTRAVENOUS | Status: AC
  Administered 2013-07-17: 18:00:00 via INTRAVENOUS

## 2013-07-17 MED ORDER — POTASSIUM CHLORIDE CRYS ER 20 MEQ PO TBCR
40.0000 meq | EXTENDED_RELEASE_TABLET | ORAL | Status: AC
  Administered 2013-07-17: 40 meq via ORAL
  Filled 2013-07-17: qty 2

## 2013-07-17 MED ORDER — DEXTROSE 5 % IV SOLN
1.0000 g | INTRAVENOUS | Status: DC
  Administered 2013-07-17 – 2013-07-19 (×3): 1 g via INTRAVENOUS
  Filled 2013-07-17 (×4): qty 10

## 2013-07-17 NOTE — Progress Notes (Addendum)
Patient was found lying in the floor. States "I was trying to get up to go to the bathroom by myself". Denies hitting her head. No bumps, bruises, or swelling to the head noted. Patient does have 2 small abrasions to her back. One abrasion near left scapula and another near right mid back. Patient is still alert and oriented x4.Patient was assisted back to her bed. Call bell in reach. Bed alarm on.  She denies any pain at this time. Vital signs are stable. Hospitalist was notified and has come up to the floor to assess the patient. No new orders received. Patient states she does not want her family called. Will continue to monitor closely. Safety sitter at bedside.

## 2013-07-17 NOTE — Progress Notes (Signed)
CRITICAL VALUE ALERT  Critical value received: Potassium  Date of notification:  07/17/13  Time of notification:  0900  Critical value read back:yes  Nurse who received alert:  Lenon Curt RN  MD notified (1st page):  Dr Candiss Norse  Time of first page:  0903  MD notified (2nd page):  Time of second page:  Responding MD:  Dr Candiss Norse  Time MD responded:  [phone]

## 2013-07-17 NOTE — Progress Notes (Signed)
Patient Demographics  Kristen Rodriguez, is a 80 y.o. female, DOB - 1924/04/06, IDP:[id number]  Admit date - 07/16/2013   Admitting Physician Allie Bossier, MD  Outpatient Primary MD for the patient is Cromwell, Hull, DO  LOS - 1   Chief Complaint  Patient presents with  . Abnormal Lab        Assessment & Plan    Sepsis due to UTI with toxic metabolic encephalopathy   Improved with IV Rocephin which will be continued, mentation back to baseline. Follow cultures.     Chronic combined systolic/diastolic CHF EF 44%  Clinically dehydrated, no acute issues, continue gentle hydration with IV fluids, hold diuretics. Hold ACE/ARB due to acute renal failure.     CAD with HTN -   Chest pain-free, blood pressure better, will continue Lopressor, Norvasc along with aspirin and Plavix.      Pulmonary hypertension  -No Acute issues, oxygen as needed outpatient pulmonary followup     Hypokalemia  -check again if needed replace aggressively IV and oral, repeat potassium and magnesium levels this evening. She is already received 2 g of magnesium earlier.     Hypomagnesemia  -replaced, recheck     HLD  -Panel checked on admission shows LDL of greater than 100, since she has underlying history of CAD we'll place her on Lipitor.     DVT  -Per daughter she has finished her anticoagulation     Dehydration with acute renal failure  Hold diuretics, hold any ACE arm on nephrotoxins, improving with gentle IV fluids which will be continued.       Code Status: DNR  Family Communication: daughter-phone  Disposition Plan: Home   Procedures     Consults     Medications  Scheduled Meds: . sodium chloride   Intravenous STAT  . amLODipine  5 mg Oral q morning - 10a  .  aspirin EC  81 mg Oral Daily  . clopidogrel  75 mg Oral q morning - 10a  . docusate sodium  100 mg Oral QHS  . enoxaparin (LOVENOX) injection  30 mg Subcutaneous Q24H  . metoprolol tartrate  25 mg Oral q morning - 10a  . mirtazapine  15 mg Oral QHS   Continuous Infusions: . sodium chloride 75 mL/hr at 07/16/13 2344  . sodium chloride     PRN Meds:.HYDROcodone-acetaminophen, nitroGLYCERIN  DVT Prophylaxis  Lovenox  Lab Results  Component Value Date   PLT 304 07/16/2013    Antibiotics    Anti-infectives   Start     Dose/Rate Route Frequency Ordered Stop   07/16/13 2000  cefTRIAXone (ROCEPHIN) 1 g in dextrose 5 % 50 mL IVPB     1 g 100 mL/hr over 30 Minutes Intravenous  Once 07/16/13 1956 07/16/13 2105          Subjective:    Kristen Rodriguez today has, No headache, No chest pain, No abdominal pain - No Nausea, No new weakness tingling or numbness, No Cough - SOB.     Objective:   Filed Vitals:   07/16/13 2019 07/16/13 2138 07/17/13 0208 07/17/13 0607  BP:  152/73 133/69 111/84  Pulse:  86 100 90  Temp:  98.9 F (37.2 C) 99.3 F (37.4 C) 98.6  F (37 C)  TempSrc:  Oral Oral Oral  Resp:  20 20 20   Height:  5\' 2"  (1.575 m)    Weight:  41.8 kg (92 lb 2.4 oz)  40.007 kg (88 lb 3.2 oz)  SpO2: 100% 100% 98% 96%    Wt Readings from Last 3 Encounters:  07/17/13 40.007 kg (88 lb 3.2 oz)  07/15/13 40.37 kg (89 lb)  01/14/13 44.053 kg (97 lb 1.9 oz)     Intake/Output Summary (Last 24 hours) at 07/17/13 0825 Last data filed at 07/17/13 0800  Gross per 24 hour  Intake      0 ml  Output    600 ml  Net   -600 ml     Physical Exam  Awake Alert, Oriented X 3, No new F.N deficits, Normal affect [place].AT,PERRAL Supple Neck,No JVD, No cervical lymphadenopathy appriciated.  Symmetrical Chest wall movement, Good air movement bilaterally, CTAB RRR,No Gallops,Rubs or new Murmurs, No Parasternal Heave +ve B.Sounds, Abd Soft, Non tender, No organomegaly appriciated, No rebound -  guarding or rigidity. No Cyanosis, Clubbing or edema, No new Rash or bruise      Data Review   Micro Results Recent Results (from the past 240 hour(s))  CULTURE, BLOOD (ROUTINE X 2)     Status: None   Collection Time    07/16/13  8:16 PM      Result Value Ref Range Status   Specimen Description Blood LEFT ANTECUBITAL   Final   Special Requests BOTTLES DRAWN AEROBIC AND ANAEROBIC [place]   Final   Culture NO GROWTH 1 DAY   Final   Report Status PENDING   Incomplete  CULTURE, BLOOD (ROUTINE X 2)     Status: None   Collection Time    07/16/13  8:21 PM      Result Value Ref Range Status   Specimen Description Blood BLOOD RIGHT HAND   Final   Special Requests BOTTLES DRAWN AEROBIC ONLY 5CC   Final   Culture NO GROWTH 1 DAY   Final   Report Status PENDING   Incomplete    Radiology Reports Ct Head Wo Contrast  07/17/2013   CLINICAL DATA:  Found down  EXAM: CT HEAD WITHOUT CONTRAST  TECHNIQUE: Contiguous axial images were obtained from the base of the skull through the vertex without intravenous contrast.  COMPARISON:  Prior CT from 06/02/2012  FINDINGS: Advanced age-related atrophy with chronic microvascular ischemic disease is stable relative to prior exam. Prominent vascular calcifications noted within the carotid siphons and distal vertebral arteries.  There is no acute intracranial hemorrhage or infarct. No mass lesion or midline shift. Gray-white matter differentiation is well maintained. Ventricles are normal in size without evidence of hydrocephalus. No extra-axial fluid collection.  The calvarium is intact.  Orbital soft tissues are within normal limits. Bilateral lens implants noted.  The paranasal sinuses and mastoid air cells are well pneumatized and free of fluid.  Scalp soft tissues are unremarkable.  IMPRESSION: 1. No acute intracranial process identified. 2. Advanced age-related atrophy with chronic microvascular ischemic disease, similar to prior.   Electronically Signed   By:  Jeannine Boga M.D.   On: 07/17/2013 04:22   Dg Chest Port 1 View  07/16/2013   CLINICAL DATA:  Elevated WBC  EXAM: PORTABLE CHEST - 1 VIEW  COMPARISON:  11/30/2011  FINDINGS: Cardiomediastinal silhouette is stable. No acute infiltrate or pleural effusion. No pulmonary edema. Degenerative changes thoracic spine.  IMPRESSION: No active disease.   Electronically Signed  By: Lahoma Crocker M.D.   On: 07/16/2013 19:32    CBC  Recent Labs Lab 07/15/13 1537 07/16/13 1905  WBC 22.7* 14.0*  HGB 11.7* 10.7*  HCT 35.1* 31.2*  PLT 408* 304  MCV 94.1 93.4  MCH 31.4 32.0  MCHC 33.3 34.3  RDW 14.2 13.9  LYMPHSABS 2.0 1.3  MONOABS 1.6* 1.1*  EOSABS 0.0 0.1  BASOSABS 0.0 0.0    Chemistries   Recent Labs Lab 07/15/13 1537 07/16/13 1905 07/16/13 1949  NA 143 136*  --   K 2.8* <2.2*  --   CL 98 92*  --   CO2 29 29  --   GLUCOSE 255* 125*  --   BUN 16 16  --   CREATININE 1.51* 1.31*  --   CALCIUM 9.8 9.7  --   MG  --   --  1.5   ------------------------------------------------------------------------------------------------------------------ estimated creatinine clearance is 19.1 ml/min (by C-G formula based on Cr of 1.31). ------------------------------------------------------------------------------------------------------------------ No results found for this basename: HGBA1C,  in the last 72 hours ------------------------------------------------------------------------------------------------------------------ No results found for this basename: CHOL, HDL, LDLCALC, TRIG, CHOLHDL, LDLDIRECT,  in the last 72 hours ------------------------------------------------------------------------------------------------------------------  Recent Labs  07/15/13 1537  TSH 1.337   ------------------------------------------------------------------------------------------------------------------ No results found for this basename: VITAMINB12, FOLATE, FERRITIN, TIBC, IRON, RETICCTPCT,  in the  last 72 hours  Coagulation profile No results found for this basename: INR, PROTIME,  in the last 168 hours  No results found for this basename: DDIMER,  in the last 72 hours  Cardiac Enzymes  Recent Labs Lab 07/16/13 1920 07/16/13 2016 07/17/13 0237  TROPONINI <0.30 <0.30 <0.30   ------------------------------------------------------------------------------------------------------------------ No components found with this basename: POCBNP,      Time Spent in minutes   35   Rushie Brazel K M.D on 07/17/2013 at 8:25 AM  Between 7am to 7pm - Pager - [phone]  After 7pm go to [email or web address] - password [email or web address]  And look for the night coverage person covering for me after hours  [facility] Office  [phone]

## 2013-07-17 NOTE — Progress Notes (Signed)
S: Received call from RN Jobe Igo that Kristen Rodriguez had attempted to go to bathroom without assistance and had fallen striking her back and head. Was informed that patient was A/O. x4 and only some small scratches on her back. Informed RN Jobe Igo that I would be up to see/assess patient.  O; Neuro; A./O. x4, NAD interactive with medical staff, cranial nerves II through XII intact, extremity strength 5/5, sensation intact throughout HENT; mild tenderness to scalp, however no visible laceration/hematoma   A/P Observed fall; since patient began to complain of headache after evaluation obtain head CT which was negative for acute findings

## 2013-07-17 NOTE — Progress Notes (Signed)
Utilization review Completed Hazelynn Mckenny RN BSN   

## 2013-07-18 DIAGNOSIS — D649 Anemia, unspecified: Secondary | ICD-10-CM

## 2013-07-18 LAB — CBC WITH DIFFERENTIAL/PLATELET
Basophils Absolute: 0 10*3/uL (ref 0.0–0.1)
Basophils Relative: 0 % (ref 0–1)
EOS ABS: 0.1 10*3/uL (ref 0.0–0.7)
EOS PCT: 1 % (ref 0–5)
HCT: 26.1 % — ABNORMAL LOW (ref 36.0–46.0)
Hemoglobin: 8.8 g/dL — ABNORMAL LOW (ref 12.0–15.0)
LYMPHS ABS: 1.5 10*3/uL (ref 0.7–4.0)
LYMPHS PCT: 22 % (ref 12–46)
MCH: 31.7 pg (ref 26.0–34.0)
MCHC: 33.7 g/dL (ref 30.0–36.0)
MCV: 93.9 fL (ref 78.0–100.0)
Monocytes Absolute: 0.7 10*3/uL (ref 0.1–1.0)
Monocytes Relative: 11 % (ref 3–12)
NEUTROS ABS: 4.4 10*3/uL (ref 1.7–7.7)
NEUTROS PCT: 65 % (ref 43–77)
Platelets: 226 10*3/uL (ref 150–400)
RBC: 2.78 MIL/uL — AB (ref 3.87–5.11)
RDW: 13.8 % (ref 11.5–15.5)
WBC: 6.7 10*3/uL (ref 4.0–10.5)

## 2013-07-18 LAB — COMPREHENSIVE METABOLIC PANEL
ALT: 11 U/L (ref 0–35)
AST: 21 U/L (ref 0–37)
Albumin: 2.1 g/dL — ABNORMAL LOW (ref 3.5–5.2)
Alkaline Phosphatase: 89 U/L (ref 39–117)
BILIRUBIN TOTAL: 0.2 mg/dL — AB (ref 0.3–1.2)
BUN: 7 mg/dL (ref 6–23)
CALCIUM: 7.8 mg/dL — AB (ref 8.4–10.5)
CHLORIDE: 105 meq/L (ref 96–112)
CO2: 26 meq/L (ref 19–32)
CREATININE: 0.94 mg/dL (ref 0.50–1.10)
GFR, EST AFRICAN AMERICAN: 61 mL/min — AB (ref >90)
GFR, EST NON AFRICAN AMERICAN: 53 mL/min — AB (ref >90)
Glucose, Bld: 103 mg/dL — ABNORMAL HIGH (ref 70–99)
Potassium: 3.7 mEq/L (ref 3.7–5.3)
Sodium: 140 mEq/L (ref 137–147)
Total Protein: 5.3 g/dL — ABNORMAL LOW (ref 6.0–8.3)

## 2013-07-18 LAB — TSH: TSH: 0.729 u[IU]/mL (ref 0.350–4.500)

## 2013-07-18 LAB — MAGNESIUM: MAGNESIUM: 1.6 mg/dL (ref 1.5–2.5)

## 2013-07-18 NOTE — Progress Notes (Signed)
[facility] PROGRESS NOTE  Kristen Rodriguez URK:[id number] DOB: July 20, 1923 DOA: 07/16/2013 PCP: Octavio Graves, DO  Assessment/Plan: Sepsis with toxic metabolic encephalopathy secondary to UTI -Sepsis and encephalopathy resolved. -Continue IV Rocephin. Mentation has returned to baseline. Cultures pending. Patient however still trying to get out of bed in the quieting safety sitter at bedside. We will order PT and nutrition consult. Patient lives alone at home and her daughter who lives a few miles away takes care of her.  Chronic CHF Patient has combined systolic and diastolic CHF with EF of 83% 2013. Patient dehydrated on presentation and getting gentle IV fluids.  Continue aspirin and metoprolol.  Coronary artery disease Continue aspirin, Plavix, and metoprolol. Added Lipitor.  Pulmonary hypertension Currently stable.  Severe hypokalemia Patient had potassium less than 2.2 and has been replenished aggressively with IV and oral potassium. Also given IV magnesium. Stable left 2.7 this morning.  Anemia H&H currently stable. Monitor for now   Code Status: DO NOT RESUSCITATE Family Communication: None at bedside Disposition Plan: pending PT eval   Consultants:  None  Procedures:  none  Antibiotics:  IV Rocephin  HPI/Subjective: Patient seen and examined this morning. Appears alert and oriented however has been trying to get out of bed and [facility] safety [facility].  Objective: Filed Vitals:   07/18/13 0616  BP: 140/59  Pulse: 105  Temp:   Resp:     Intake/Output Summary (Last 24 hours) at 07/18/13 0819 Last data filed at 07/17/13 1804  Gross per 24 hour  Intake    240 ml  Output    400 ml  Net   -160 ml   Filed Weights   07/16/13 1840 07/16/13 2138 07/17/13 0607  Weight: 40.37 kg (89 lb) 41.8 kg (92 lb 2.4 oz) 40.007 kg (88 lb 3.2 oz)    Exam:   General:  Elderly thin built female in no acute distress  HEENT: No pallor, no icterus, moist oral  mucosa  Chest: Clear to auscultation bilaterally, no added sounds  CVS: Normal S1-S2, no murmurs or gallop  Abdomen: Soft, nontender, nondistended, bowel sounds present  Extremities: Warm, no edema  Since: AAO x3    Data Reviewed: Basic Metabolic Panel:  Recent Labs Lab 07/15/13 1537 07/16/13 1905 07/16/13 1949 07/17/13 0816 07/17/13 1745 07/18/13 0441  NA 143 136*  --  136*  --  140  K 2.8* <2.2*  --  <2.2* 3.7 3.7  CL 98 92*  --  94*  --  105  CO2 29 29  --  29  --  26  GLUCOSE 255* 125*  --  105*  --  103*  BUN 16 16  --  11  --  7  CREATININE 1.51* 1.31*  --  1.05  --  0.94  CALCIUM 9.8 9.7  --  9.0  --  7.8*  MG  --   --  1.5 2.1 1.9 1.6   Liver Function Tests:  Recent Labs Lab 07/17/13 0816 07/18/13 0441  AST 19 21  ALT 11 11  ALKPHOS 100 89  BILITOT 0.5 0.2*  PROT 5.8* 5.3*  ALBUMIN 2.4* 2.1*   No results found for this basename: LIPASE, AMYLASE,  in the last 168 hours No results found for this basename: AMMONIA,  in the last 168 hours CBC:  Recent Labs Lab 07/15/13 1537 07/16/13 1905 07/17/13 0816 07/18/13 0441  WBC 22.7* 14.0* 10.0 6.7  NEUTROABS 19.1* 11.5* 7.1 4.4  HGB 11.7* 10.7* 9.9* 8.8*  HCT  35.1* 31.2* 29.1* 26.1*  MCV 94.1 93.4 93.3 93.9  PLT 408* 304 265 226   Cardiac Enzymes:  Recent Labs Lab 07/16/13 1920 07/16/13 2016 07/17/13 0237 07/17/13 0816  TROPONINI <0.30 <0.30 <0.30 <0.30   BNP (last 3 results)  Recent Labs  07/16/13 2016  PROBNP 2370.0*   CBG: No results found for this basename: GLUCAP,  in the last 168 hours  Recent Results (from the past 240 hour(s))  CULTURE, BLOOD (ROUTINE X 2)     Status: None   Collection Time    07/16/13  8:16 PM      Result Value Ref Range Status   Specimen Description Blood LEFT ANTECUBITAL   Final   Special Requests BOTTLES DRAWN AEROBIC AND ANAEROBIC [place]   Final   Culture NO GROWTH 2 DAYS   Final   Report Status PENDING   Incomplete  CULTURE, BLOOD (ROUTINE X 2)      Status: None   Collection Time    07/16/13  8:21 PM      Result Value Ref Range Status   Specimen Description Blood BLOOD RIGHT HAND   Final   Special Requests BOTTLES DRAWN AEROBIC ONLY 5CC   Final   Culture NO GROWTH 2 DAYS   Final   Report Status PENDING   Incomplete     Studies: Ct Head Wo Contrast  07/17/2013   CLINICAL DATA:  Found down  EXAM: CT HEAD WITHOUT CONTRAST  TECHNIQUE: Contiguous axial images were obtained from the base of the skull through the vertex without intravenous contrast.  COMPARISON:  Prior CT from 06/02/2012  FINDINGS: Advanced age-related atrophy with chronic microvascular ischemic disease is stable relative to prior exam. Prominent vascular calcifications noted within the carotid siphons and distal vertebral arteries.  There is no acute intracranial hemorrhage or infarct. No mass lesion or midline shift. Gray-white matter differentiation is well maintained. Ventricles are normal in size without evidence of hydrocephalus. No extra-axial fluid collection.  The calvarium is intact.  Orbital soft tissues are within normal limits. Bilateral lens implants noted.  The paranasal sinuses and mastoid air cells are well pneumatized and free of fluid.  Scalp soft tissues are unremarkable.  IMPRESSION: 1. No acute intracranial process identified. 2. Advanced age-related atrophy with chronic microvascular ischemic disease, similar to prior.   Electronically Signed   By: Jeannine Boga M.D.   On: 07/17/2013 04:22   Dg Chest Port 1 View  07/16/2013   CLINICAL DATA:  Elevated WBC  EXAM: PORTABLE CHEST - 1 VIEW  COMPARISON:  11/30/2011  FINDINGS: Cardiomediastinal silhouette is stable. No acute infiltrate or pleural effusion. No pulmonary edema. Degenerative changes thoracic spine.  IMPRESSION: No active disease.   Electronically Signed   By: Lahoma Crocker M.D.   On: 07/16/2013 19:32    Scheduled Meds: . amLODipine  5 mg Oral q morning - 10a  . aspirin EC  81 mg Oral Daily  .  atorvastatin  10 mg Oral q1800  . cefTRIAXone (ROCEPHIN)  IV  1 g Intravenous Q24H  . clopidogrel  75 mg Oral q morning - 10a  . docusate sodium  100 mg Oral QHS  . enoxaparin (LOVENOX) injection  30 mg Subcutaneous Q24H  . metoprolol tartrate  25 mg Oral q morning - 10a  . mirtazapine  15 mg Oral QHS   Continuous Infusions: . sodium chloride Stopped (07/18/13 0716)     Time spent: 25 minutes    Frayda Egley  [facility] Pager  [phone]. If 7PM-7AM, please contact night-coverage at [email or web address], password [facility] 07/18/2013, 8:19 AM  LOS: 2 days

## 2013-07-19 DIAGNOSIS — I2129 ST elevation (STEMI) myocardial infarction involving other sites: Secondary | ICD-10-CM

## 2013-07-19 LAB — COMPREHENSIVE METABOLIC PANEL
ALBUMIN: 2.1 g/dL — AB (ref 3.5–5.2)
ALT: 11 U/L (ref 0–35)
AST: 18 U/L (ref 0–37)
Alkaline Phosphatase: 90 U/L (ref 39–117)
BILIRUBIN TOTAL: 0.2 mg/dL — AB (ref 0.3–1.2)
BUN: 7 mg/dL (ref 6–23)
CHLORIDE: 102 meq/L (ref 96–112)
CO2: 28 mEq/L (ref 19–32)
CREATININE: 0.96 mg/dL (ref 0.50–1.10)
Calcium: 7.4 mg/dL — ABNORMAL LOW (ref 8.4–10.5)
GFR calc Af Amer: 60 mL/min — ABNORMAL LOW (ref >90)
GFR calc non Af Amer: 52 mL/min — ABNORMAL LOW (ref >90)
Glucose, Bld: 94 mg/dL (ref 70–99)
Potassium: 2.9 mEq/L — CL (ref 3.7–5.3)
Sodium: 141 mEq/L (ref 137–147)
TOTAL PROTEIN: 5.4 g/dL — AB (ref 6.0–8.3)

## 2013-07-19 LAB — CBC WITH DIFFERENTIAL/PLATELET
Basophils Absolute: 0 10*3/uL (ref 0.0–0.1)
Basophils Relative: 0 % (ref 0–1)
Eosinophils Absolute: 0.3 10*3/uL (ref 0.0–0.7)
Eosinophils Relative: 4 % (ref 0–5)
HEMATOCRIT: 28.1 % — AB (ref 36.0–46.0)
Hemoglobin: 9.5 g/dL — ABNORMAL LOW (ref 12.0–15.0)
LYMPHS PCT: 28 % (ref 12–46)
Lymphs Abs: 2.1 10*3/uL (ref 0.7–4.0)
MCH: 32 pg (ref 26.0–34.0)
MCHC: 33.8 g/dL (ref 30.0–36.0)
MCV: 94.6 fL (ref 78.0–100.0)
MONO ABS: 1 10*3/uL (ref 0.1–1.0)
Monocytes Relative: 13 % — ABNORMAL HIGH (ref 3–12)
NEUTROS ABS: 3.9 10*3/uL (ref 1.7–7.7)
Neutrophils Relative %: 54 % (ref 43–77)
Platelets: 222 10*3/uL (ref 150–400)
RBC: 2.97 MIL/uL — ABNORMAL LOW (ref 3.87–5.11)
RDW: 13.7 % (ref 11.5–15.5)
WBC: 7.2 10*3/uL (ref 4.0–10.5)

## 2013-07-19 LAB — URINE CULTURE: Colony Count: 50000

## 2013-07-19 LAB — MAGNESIUM: Magnesium: 1.5 mg/dL (ref 1.5–2.5)

## 2013-07-19 MED ORDER — POTASSIUM CHLORIDE CRYS ER 20 MEQ PO TBCR
40.0000 meq | EXTENDED_RELEASE_TABLET | Freq: Two times a day (BID) | ORAL | Status: AC
  Administered 2013-07-19 (×2): 40 meq via ORAL
  Filled 2013-07-19: qty 4
  Filled 2013-07-19: qty 2

## 2013-07-19 MED ORDER — MAGNESIUM SULFATE 40 MG/ML IJ SOLN
2.0000 g | Freq: Once | INTRAMUSCULAR | Status: AC
  Administered 2013-07-19: 2 g via INTRAVENOUS
  Filled 2013-07-19: qty 50

## 2013-07-19 MED ORDER — ENSURE COMPLETE PO LIQD
237.0000 mL | Freq: Two times a day (BID) | ORAL | Status: DC
  Administered 2013-07-21 (×2): 237 mL via ORAL

## 2013-07-19 MED ORDER — POTASSIUM CHLORIDE 10 MEQ/100ML IV SOLN
10.0000 meq | INTRAVENOUS | Status: AC
  Administered 2013-07-19 (×4): 10 meq via INTRAVENOUS

## 2013-07-19 MED ORDER — POTASSIUM CHLORIDE CRYS ER 20 MEQ PO TBCR
60.0000 meq | EXTENDED_RELEASE_TABLET | Freq: Once | ORAL | Status: AC
  Administered 2013-07-19: 60 meq via ORAL
  Filled 2013-07-19: qty 3

## 2013-07-19 NOTE — Progress Notes (Signed)
Patient ID: Kristen Rodriguez, female   DOB: Nov 17, 1923, 80 y.o.   MRN: [id number]  [facility] PROGRESS NOTE  VERITA KURODA BTD:[id number] DOB: [birth year] DOA: 07/16/2013 PCP: Octavio Graves, DO  Brief narrative: Patient is 80 year old female with history of left breast cancer diagnosed in 2004 status post lumpectomy, chronic systolic and diastolic CHF, pulmonary hypertension, CAD, posterior wall MI in 2012, chronic right nephrolithiasis with recurrent UTI's. She presented to emergency department after noted to be more confused at home. She lives at home alone and daughter who lives close by takes care of her. In emergency department patient noted to be confused, unable to provide history. Urinalysis suggestive of UTI as the main source. [facility] asked to admit for further evaluation and management of presumptive UTI.  Assessment/Plan:  Sepsis with toxic metabolic encephalopathy secondary to UTI  - Sepsis and encephalopathy resolved - Patient still mostly bedbound, explains she is not hungry and does not want to try eating this morning, she is also refusing to get out of the bed - PT evaluation requested, patient will likely need skilled nursing facility upon discharge - continue Rocephin and follow up on urine culture  Chronic CHF  - Combined systolic and diastolic CHF with last ejection fraction of 45% per 2-D echo in 2013 - Encourage by mouth intake, we'll discontinue IV fluids - Continue aspirin and metoprolol.  Coronary artery disease  - Continue aspirin, Plavix, and metoprolol. Added Lipitor.  Pulmonary hypertension  - Currently stable.  Severe hypokalemia  - Initial potassium level on admission was 2.2, this has been aggressively supplemented - Still low this morning, will supplement and will also supplement magnesium - Check electrolyte panel in the morning Anemia of chronic disease  - H&H currently stable - No signs of active bleeding - Repeat CBC in the morning Severe  malnutrition with BMP < 18 - Encourage by mouth intake with patient refusing - SLP evaluation, nutrition evaluation requested Functional quadriplegia - Secondary to acute illness imposed on chronic deconditioning and failure to thrive - PT evaluation pending  Consultants:  None Procedures:  None  Antibiotics:  IV Rocephin 4/3 -->  Code Status: DNR Family Communication: No family at bedside  Disposition Plan: Will need SNF, PT evaluation pending   HPI/Subjective: No events overnight.   Objective: Filed Vitals:   07/18/13 0616 07/18/13 1425 07/18/13 2040 07/19/13 0612  BP: 140/59 144/67 143/63 126/54  Pulse: 105 74 79 67  Temp:  98.3 F (36.8 C) 98.7 F (37.1 C) 97.3 F (36.3 C)  TempSrc:  Oral Oral Oral  Resp:  20 20 20   Height:      Weight:    43.681 kg (96 lb 4.8 oz)  SpO2:  98% 97% 99%    Intake/Output Summary (Last 24 hours) at 07/19/13 1041 Last data filed at 07/19/13 0824  Gross per 24 hour  Intake    460 ml  Output   1700 ml  Net  -1240 ml    Exam:   General:  Pt is alert, follows some commands appropriately, not in acute distress, very cachectic  Cardiovascular: Regular rate and rhythm, S1/S2, no murmurs, no rubs, no gallops  Respiratory: Clear to auscultation bilaterally, diminished breath sounds at bases   Abdomen: Soft, non tender, non distended, bowel sounds present, no guarding  Extremities: No edema, pulses DP and PT palpable bilaterally  Data Reviewed: Basic Metabolic Panel:  Recent Labs Lab 07/15/13 1537 07/16/13 1905 07/16/13 1949 07/17/13 0816 07/17/13 1745 07/18/13 0441 07/19/13  0524  NA 143 136*  --  136*  --  140 141  K 2.8* <2.2*  --  <2.2* 3.7 3.7 2.9*  CL 98 92*  --  94*  --  105 102  CO2 29 29  --  29  --  26 28  GLUCOSE 255* 125*  --  105*  --  103* 94  BUN 16 16  --  11  --  7 7  CREATININE 1.51* 1.31*  --  1.05  --  0.94 0.96  CALCIUM 9.8 9.7  --  9.0  --  7.8* 7.4*  MG  --   --  1.5 2.1 1.9 1.6 1.5   Liver  Function Tests:  Recent Labs Lab 07/17/13 0816 07/18/13 0441 07/19/13 0524  AST 19 21 18   ALT 11 11 11   ALKPHOS 100 89 90  BILITOT 0.5 0.2* 0.2*  PROT 5.8* 5.3* 5.4*  ALBUMIN 2.4* 2.1* 2.1*   CBC:  Recent Labs Lab 07/15/13 1537 07/16/13 1905 07/17/13 0816 07/18/13 0441 07/19/13 0524  WBC 22.7* 14.0* 10.0 6.7 7.2  NEUTROABS 19.1* 11.5* 7.1 4.4 3.9  HGB 11.7* 10.7* 9.9* 8.8* 9.5*  HCT 35.1* 31.2* 29.1* 26.1* 28.1*  MCV 94.1 93.4 93.3 93.9 94.6  PLT 408* 304 265 226 222   Cardiac Enzymes:  Recent Labs Lab 07/16/13 1920 07/16/13 2016 07/17/13 0237 07/17/13 0816  TROPONINI <0.30 <0.30 <0.30 <0.30   Scheduled Meds: . amLODipine  5 mg Oral q morning - 10a  . aspirin EC  81 mg Oral Daily  . atorvastatin  10 mg Oral q1800  . cefTRIAXone   IV  1 g Intravenous Q24H  . clopidogrel  75 mg Oral q morning - 10a  . docusate sodium  100 mg Oral QHS  . enoxaparin  injection  30 mg Subcutaneous Q24H  . magnesium sulfate 1 - 4   2 g Intravenous Once  . metoprolol tartrate  25 mg Oral q morning - 10a  . mirtazapine  15 mg Oral QHS  . potassium chloride  10 mEq Intravenous Q1 Hr x 4  . potassium chloride  40 mEq Oral BID   Continuous Infusions:  Faye Ramsay, MD  [facility] Pager [phone]  If 7PM-7AM, please contact night-coverage [email or web address] Password [email or web address] 07/19/2013, 10:41 AM   LOS: 3 days

## 2013-07-19 NOTE — Progress Notes (Signed)
INITIAL NUTRITION ASSESSMENT  DOCUMENTATION CODES Per approved criteria  -Underweight   INTERVENTION: Ensure Complete po BID, each supplement provides 350 kcal and 13 grams of protein. Recommend liberalize diet as much as medically feasible to improve PO intake.   NUTRITION DIAGNOSIS: Inadequate oral intake related to decreased appetite as evidenced by PO: 0-75%.   Goal: Pt will meet >90% of estimated nutritional needs  Monitor:  PO intake, labs, skin assessments, weight changes, I/O's  Reason for Assessment: Consult to assess nutritional status  80 y.o. female  Admitting Dx: UTI (lower urinary tract infection)  ASSESSMENT: Pt admitted with urosepsis, hypokalemia, and elevated WBC's. She is currently on IV rocephin. Noted fall during admission on 07/17/13. Pt with low BMI at baseline per wt hx. Weight has increased over the past year. Intake has been variable; PO: 0-75%. Per nursing, pt has been refusing food. Noted SLP evaluation has been deferred to 07/20/13.  Pt was unavailable for interview and exam during multiple visits, due to multiple evaluations from the healthcare team.  Unable to diagnose malnutrition at this time, however, pt is at risk given low BMI and refusal of meals.  Height: Ht Readings from Last 1 Encounters:  07/16/13 5\' 2"  (1.575 m)    Weight: Wt Readings from Last 1 Encounters:  07/19/13 96 lb 4.8 oz (43.681 kg)    Ideal Body Weight: 110#  % Ideal Body Weight: 87%  Wt Readings from Last 10 Encounters:  07/19/13 96 lb 4.8 oz (43.681 kg)  07/15/13 89 lb (40.37 kg)  01/14/13 97 lb 1.9 oz (44.053 kg)  02/06/12 91 lb 12.8 oz (41.64 kg)  12/04/11 115 lb 1.3 oz (52.2 kg)  12/04/11 115 lb 1.3 oz (52.2 kg)  11/08/11 97 lb (43.999 kg)  10/12/11 92 lb 2.4 oz (41.8 kg)  10/12/11 92 lb 2.4 oz (41.8 kg)  08/15/11 91 lb (41.277 kg)    Usual Body Weight: 92#  % Usual Body Weight: 104%  BMI:  Body mass index is 17.61 kg/(m^2). Meets criteria for  underweight.   Estimated Nutritional Needs: Kcal: 1100-1300 daily Protein: 43-54 grams daily Fluid: 1.1-1.3 L daily  Skin: upper back abrasion  Diet Order: Cardiac  EDUCATION NEEDS: -Education not appropriate at this time   Intake/Output Summary (Last 24 hours) at 07/19/13 1443 Last data filed at 07/19/13 1215  Gross per 24 hour  Intake    700 ml  Output    900 ml  Net   -200 ml    Last BM: 07/18/13  Labs:   Recent Labs Lab 07/17/13 0816 07/17/13 1745 07/18/13 0441 07/19/13 0524  NA 136*  --  140 141  K <2.2* 3.7 3.7 2.9*  CL 94*  --  105 102  CO2 29  --  26 28  BUN 11  --  7 7  CREATININE 1.05  --  0.94 0.96  CALCIUM 9.0  --  7.8* 7.4*  MG 2.1 1.9 1.6 1.5  GLUCOSE 105*  --  103* 94    CBG (last 3)  No results found for this basename: GLUCAP,  in the last 72 hours  Scheduled Meds: . amLODipine  5 mg Oral q morning - 10a  . aspirin EC  81 mg Oral Daily  . atorvastatin  10 mg Oral q1800  . cefTRIAXone (ROCEPHIN)  IV  1 g Intravenous Q24H  . clopidogrel  75 mg Oral q morning - 10a  . docusate sodium  100 mg Oral QHS  . enoxaparin (LOVENOX) injection  30 mg Subcutaneous Q24H  . metoprolol tartrate  25 mg Oral q morning - 10a  . mirtazapine  15 mg Oral QHS  . potassium chloride  40 mEq Oral BID    Continuous Infusions:   Past Medical History  Diagnosis Date  . Precordial pain   . Old myocardial infarction   . Hypopotassemia   . Esophageal reflux   . Allergic rhinitis, cause unspecified   . Pure hypercholesterolemia   . Unspecified essential hypertension   . Family history of ischemic heart disease   . CAD (coronary artery disease) 07/2000    Status Post NSTEMI, 60% mid RCA lesion treated medically  . Breast cancer 2006  . DVT (deep venous thrombosis) 2006  . Macular degeneration   . CHF (congestive heart failure)   . H/O hiatal hernia     Past Surgical History  Procedure Laterality Date  . Lumbar laminectomy    . Cataract extraction,  bilateral    . Abdominal hysterectomy    . Corneal implant      left  . Cardiac catheterization  2002    Without PCI  . Cardiac catheterization  10/08/2011    1.  Inferior MI with occlusion of the PLA with reperfusion with POBA, stent placed proximally RCA  for high grade disease  . Total hip arthroplasty  02/2010    Right hip by Nilda Calamity Polmeritis    . Colonoscopy  09/17/2005    Tubular adenoma fragments  . Femur im nail  12/01/2011    Procedure: INTRAMEDULLARY (IM) NAIL FEMORAL;  Surgeon: Johnn Hai, MD;  Location: [facility];  Service: Orthopedics;  Laterality: Left;  left hipintertrochanteric nail  ([employer])    Balinda Heacock A. Jimmye Norman, RD, LDN Pager: [phone]

## 2013-07-19 NOTE — Care Management Note (Addendum)
    Page 1 of 2   07/21/2013     4:57:16 PM   CARE MANAGEMENT NOTE 07/21/2013  Patient:  Kristen Rodriguez, Kristen Rodriguez   Account Number:  [id number]  Date Initiated:  07/19/2013  Documentation initiated by:  Theophilus Kinds  Subjective/Objective Assessment:   Pt admitted from home with UTI and hypokalemia. Pt lives alone and has a daughter who lives in town and checks on pt frequently. Pt stated that she uses a cane and has a walker if she needs it. Pt stated that she is fairly independent with .     Action/Plan:   PT recommends SNF but pt is refusing. Pt also refusing HH at this time. WIll continue to follow for discharge planning needs.   Anticipated DC Date:  07/21/2013   Anticipated DC Plan:  [place]  In-house referral  Clinical Social Worker      DC Planning Services  CM consult      [facility] Choice  HOME HEALTH   Choice offered to / List presented to:  C-4 Adult Children        [place] arranged  HH-1 RN  [name]   Status of service:  Completed, signed off Medicare Important Message given?  YES (If response is "NO", the following Medicare IM given date fields will be blank) Date Medicare IM given:  07/21/2013 Date Additional Medicare IM given:    Discharge Disposition:  [place]  Per UR Regulation:    If discussed at Long Length of Stay Meetings, dates discussed:    Comments:  07/21/13 [place] BSN CM Pt discharging to her home, [place], [place] with [facility] RN, PT and [place]. Daughter Kristen Rodriguez selected [place]. Kristen Rodriguez with [facility] alerted to this referral. Kristen Rodriguez is the contact person and her cell is [phone]  07/21/13 [place], RN BSN CM Pt potential discharge 07/21/13. Pt still refuses HH or SNF. Pts daughter Kristen Rodriguez is also aware of pts discharge disposition. Pt and pts nurse aware of discharge arrangements.  07/19/13 [place], RN BSN CM

## 2013-07-19 NOTE — Progress Notes (Signed)
SLP Cancellation Note  Patient Details Name: Kristen Rodriguez MRN: [id number] DOB: 1923/10/03   Cancelled treatment:  ST received order.  Evaluation deferred to 07/20/13.   Sharman Crate [place], [facility] 07/19/2013, 1:30 PM

## 2013-07-19 NOTE — Progress Notes (Signed)
CRITICAL VALUE ALERT  Critical value received:  Potassium 2.9  Date of notification:  07/19/13  Time of notification:  0648  Critical value read back:yes  Nurse who received alert:  S. Chrisandra Carota  MD notified (1st page):  Fanny Bien  Time of first page:  [phone]  MD notified (2nd page):  Time of second page:  Responding MD:  Fanny Bien  Time MD responded:  [phone]  Orders for PO and IV potassium given. Will continue to monitor.

## 2013-07-19 NOTE — Evaluation (Signed)
Physical Therapy Evaluation Patient Details Name: SHAELIN LALLEY MRN: [id number] DOB: Sep 02, 1923 Today's Date: 07/19/2013   History of Present Illness   Kristen Rodriguez is a 80 y.o. WF PMHx  Hx left breast cancer diagnosed 2004, S/P lumpectomy (negative XRT/chemotherapy), chronic pain, PVCs, chronic systolic and diastolic CHF, pulmonary hypertension, CAD, Hx posterior wall MI 10/2010, anemia, HLD, chronic right nephrolithiasis with frequent UTI. Presented to ED mental status change and fatigue. Urinalysis shows patient with UTI initial WBC= 14,000. Per daughter mental status change with increasing fatigue  Clinical Impression  Pt is an 80 yo female who normally lives I.  Pt was fatigued after ambulating 75 ft which concerned therapist.  Therapist spoke to pt about SNF but pt states she is not interested in this therefore pt will be referred to PT [facility] as well as in the hospital    Follow Up Recommendations Home health PT    Equipment Recommendations  None recommended by PT    Recommendations for Other Services   OT    Precautions / Restrictions Precautions Precautions: None Restrictions Weight Bearing Restrictions: No      Mobility  Bed Mobility Overal bed mobility: Modified Independent         Transfers Overall transfer level: Modified independent     Ambulation/Gait Ambulation/Gait assistance: Modified independent (Device/Increase time) Ambulation Distance (Feet): 75 Feet Assistive device: Rolling walker (2 wheeled)     Gait velocity interpretation: at or above normal speed for age/gender                 Pertinent Vitals/Pain 7/10 IV site    Home Living Family/patient expects to be discharged to:: Private residence (Pt does not want to go to SNF) Living Arrangements: Alone Available Help at Discharge: Family;Available PRN/intermittently Type of Home: House Home Access: Stairs to enter   [employer] of Steps: 1 Home Layout: One level        Prior  Function Level of Independence: Independent with assistive device(s)        Comments: normally uses a cane     Hand Dominance   Dominant Hand: Right    Extremity/Trunk Assessment        Lower Extremity Assessment: Overall WFL for tasks assessed         Communication   Communication: No difficulties  Cognition Arousal/Alertness: Awake/alert   Overall Cognitive Status: Within Functional Limits for tasks assessed                      General Comments      Exercises General Exercises - Lower Extremity Ankle Circles/Pumps: Both;10 reps Long Arc Quad: Both;10 reps Hip ABduction/ADduction: Both;10 reps Straight Leg Raises: Both;10 reps      Assessment/Plan    PT Assessment Patient needs continued PT services  PT Diagnosis Generalized weakness   PT Problem List Decreased strength;Decreased activity tolerance  PT Treatment Interventions Gait training;Therapeutic exercise   PT Goals (Current goals can be found in the Care Plan section) Acute Rehab PT Goals Patient Stated Goal: Pt states she wants to go home she does not want to go to SNF PT Goal Formulation: With patient Time For Goal Achievement: 07/21/13 Potential to Achieve Goals: Good    Frequency Min 3X/week   Barriers to discharge Decreased caregiver support            End of Session Equipment Utilized During Treatment: Gait belt Activity Tolerance: Patient tolerated treatment well Patient left: in chair;with call bell/phone  within reach;with bed alarm set           Time: 1304-1340 PT Time Calculation (min): 36 min   Charges:   PT Evaluation $Initial PT Evaluation Tier I: 1 Procedure             RUSSELL,CINDY 07/19/2013, 1:43 PM

## 2013-07-20 LAB — COMPREHENSIVE METABOLIC PANEL
ALK PHOS: 107 U/L (ref 39–117)
ALT: 13 U/L (ref 0–35)
AST: 23 U/L (ref 0–37)
Albumin: 2.2 g/dL — ABNORMAL LOW (ref 3.5–5.2)
BILIRUBIN TOTAL: 0.2 mg/dL — AB (ref 0.3–1.2)
BUN: 6 mg/dL (ref 6–23)
CO2: 25 meq/L (ref 19–32)
CREATININE: 0.89 mg/dL (ref 0.50–1.10)
Calcium: 7.2 mg/dL — ABNORMAL LOW (ref 8.4–10.5)
Chloride: 104 mEq/L (ref 96–112)
GFR calc Af Amer: 66 mL/min — ABNORMAL LOW (ref >90)
GFR, EST NON AFRICAN AMERICAN: 57 mL/min — AB (ref >90)
Glucose, Bld: 86 mg/dL (ref 70–99)
Potassium: 6 mEq/L — ABNORMAL HIGH (ref 3.7–5.3)
SODIUM: 137 meq/L (ref 137–147)
Total Protein: 5.8 g/dL — ABNORMAL LOW (ref 6.0–8.3)

## 2013-07-20 LAB — CBC WITH DIFFERENTIAL/PLATELET
Basophils Absolute: 0.1 10*3/uL (ref 0.0–0.1)
Basophils Relative: 1 % (ref 0–1)
EOS ABS: 0.3 10*3/uL (ref 0.0–0.7)
EOS PCT: 4 % (ref 0–5)
HEMATOCRIT: 29.3 % — AB (ref 36.0–46.0)
Hemoglobin: 9.7 g/dL — ABNORMAL LOW (ref 12.0–15.0)
Lymphocytes Relative: 29 % (ref 12–46)
Lymphs Abs: 2.1 10*3/uL (ref 0.7–4.0)
MCH: 31.7 pg (ref 26.0–34.0)
MCHC: 33.1 g/dL (ref 30.0–36.0)
MCV: 95.8 fL (ref 78.0–100.0)
MONO ABS: 0.6 10*3/uL (ref 0.1–1.0)
Monocytes Relative: 9 % (ref 3–12)
Neutro Abs: 4.1 10*3/uL (ref 1.7–7.7)
Neutrophils Relative %: 57 % (ref 43–77)
Platelets: 249 10*3/uL (ref 150–400)
RBC: 3.06 MIL/uL — ABNORMAL LOW (ref 3.87–5.11)
RDW: 13.8 % (ref 11.5–15.5)
WBC: 7.2 10*3/uL (ref 4.0–10.5)

## 2013-07-20 LAB — BASIC METABOLIC PANEL
BUN: 7 mg/dL (ref 6–23)
CHLORIDE: 102 meq/L (ref 96–112)
CO2: 23 meq/L (ref 19–32)
CREATININE: 0.83 mg/dL (ref 0.50–1.10)
Calcium: 7.3 mg/dL — ABNORMAL LOW (ref 8.4–10.5)
GFR calc Af Amer: 71 mL/min — ABNORMAL LOW (ref >90)
GFR calc non Af Amer: 62 mL/min — ABNORMAL LOW (ref >90)
Glucose, Bld: 141 mg/dL — ABNORMAL HIGH (ref 70–99)
Potassium: 5.8 mEq/L — ABNORMAL HIGH (ref 3.7–5.3)
Sodium: 136 mEq/L — ABNORMAL LOW (ref 137–147)

## 2013-07-20 LAB — MAGNESIUM: Magnesium: 1.9 mg/dL (ref 1.5–2.5)

## 2013-07-20 MED ORDER — CIPROFLOXACIN HCL 250 MG PO TABS
250.0000 mg | ORAL_TABLET | Freq: Two times a day (BID) | ORAL | Status: DC
  Administered 2013-07-20 – 2013-07-21 (×3): 250 mg via ORAL
  Filled 2013-07-20 (×3): qty 1

## 2013-07-20 NOTE — Progress Notes (Signed)
SLP Cancellation Note  Patient Details Name: Kristen Rodriguez MRN: [id number] DOB: [birth year]   Cancelled treatment:       Reason Eval/Treat Not Completed: Other (comment)  Pt alert and spoke with SLP, did observe taking meds whole with water without issue   Pt states that she just doesn't eat much but denies any swallow issues, likes to drink more than eat but refuses to take any supplements   Refused any po during time SLP present   Unable to fully assess   Pollyann Glen 07/20/2013, 12:44 PM

## 2013-07-20 NOTE — Progress Notes (Signed)
Patient ID: Kristen Rodriguez, female   DOB: Mar 21, 1924, 80 y.o.   MRN: [id number]  [facility] PROGRESS NOTE  Kristen Rodriguez VPX:[id number] DOB: [birth year] DOA: 07/16/2013 PCP: Octavio Graves, DO  Brief narrative:  Patient is 80 year old female with history of left breast cancer diagnosed in 2004 status post lumpectomy, chronic systolic and diastolic CHF, pulmonary hypertension, CAD, posterior wall MI in 2012, chronic right nephrolithiasis with recurrent UTI's. She presented to emergency department after noted to be more confused at home. She lives at home alone and daughter who lives close by takes care of her. In emergency department patient noted to be confused, unable to provide history. Urinalysis suggestive of UTI as the main source. [facility] hospitalist asked to admit for further evaluation and management of presumptive UTI.   Assessment/Plan:  Sepsis with toxic metabolic encephalopathy secondary to UTI  - Sepsis and encephalopathy resolved  - Patient still mostly bedbound, explains she is not hungry and does not want to try eating this morning, she is also refusing to get out of the bed  - PT evaluation requested, recommending home health PT - urine culture with E. Coli and sensitive to rocephin, cipro - will change to oral Cipro and pt needs two more days to complete 7 days therapy, stop date 4/9 Chronic CHF  - Combined systolic and diastolic CHF with last ejection fraction of 45% per 2-D echo in 2013  - Encourage by mouth intake, we'll discontinue IV fluids  - Continue aspirin and metoprolol.  - weight is 93 lbs this AM Coronary artery disease  - Continue aspirin, Plavix, and metoprolol. Added Lipitor.  Pulmonary hypertension  - Currently stable.  Severe hypokalemia  - Initial potassium level on admission was 2.2, this has been aggressively supplemented  - now on high side and K is 6 this AM, will repeat BMP now to ensure is elevated before we give kayexalate  - will stop all  supplementations and will repeat BMP in AM Anemia of chronic disease  - H&H currently stable  - No signs of active bleeding  - Repeat CBC in the morning  Severe malnutrition with BMP < 18  - Encourage by mouth intake with patient refusing  - SLP evaluation requested but pt refusing any supplementation and unable to complete evaluation  Functional quadriplegia  - Secondary to acute illness imposed on chronic deconditioning and failure to thrive  - PT evaluation done and HHPT recommended   Consultants:  None Procedures:  None  Antibiotics:  IV Rocephin 4/3 --> 4/7 Cipro 4/7 --> 4/9  Code Status: DNR  Family Communication: No family at bedside  Disposition Plan: Home likely in AM if K is stable  HPI/Subjective: No events overnight.   Objective: Filed Vitals:   07/20/13 4627 07/20/13 0646 07/20/13 1034 07/20/13 1429  BP: 153/71 162/74 142/95 127/49  Pulse: 88 89  108  Temp:    98.2 F (36.8 C)  TempSrc:    Oral  Resp: 20   18  Height:      Weight:      SpO2:    100%    Intake/Output Summary (Last 24 hours) at 07/20/13 1604 Last data filed at 07/20/13 1557  Gross per 24 hour  Intake      0 ml  Output    250 ml  Net   -250 ml    Exam:   General:  Pt is alert, follows commands appropriately, not in acute distress  Cardiovascular: Regular rate and rhythm, S1/S2,  no murmurs, no rubs, no gallops  Respiratory: Clear to auscultation bilaterally, no wheezing, no crackles, no rhonchi  Abdomen: Soft, non tender, non distended, bowel sounds present, no guarding  Extremities: No edema, pulses DP and PT palpable bilaterally  Neuro: Grossly nonfocal  Data Reviewed: Basic Metabolic Panel:  Recent Labs Lab 07/16/13 1905  07/17/13 0816 07/17/13 1745 07/18/13 0441 07/19/13 0524 07/20/13 0523  NA 136*  --  136*  --  140 141 137  K <2.2*  --  <2.2* 3.7 3.7 2.9* 6.0*  CL 92*  --  94*  --  105 102 104  CO2 29  --  29  --  26 28 25   GLUCOSE 125*  --  105*  --  103*  94 86  BUN 16  --  11  --  7 7 6   CREATININE 1.31*  --  1.05  --  0.94 0.96 0.89  CALCIUM 9.7  --  9.0  --  7.8* 7.4* 7.2*  MG  --   < > 2.1 1.9 1.6 1.5 1.9  < > = values in this interval not displayed. Liver Function Tests:  Recent Labs Lab 07/17/13 0816 07/18/13 0441 07/19/13 0524 07/20/13 0523  AST 19 21 18 23   ALT 11 11 11 13   ALKPHOS 100 89 90 107  BILITOT 0.5 0.2* 0.2* 0.2*  PROT 5.8* 5.3* 5.4* 5.8*  ALBUMIN 2.4* 2.1* 2.1* 2.2*   CBC:  Recent Labs Lab 07/16/13 1905 07/17/13 0816 07/18/13 0441 07/19/13 0524 07/20/13 0523  WBC 14.0* 10.0 6.7 7.2 7.2  NEUTROABS 11.5* 7.1 4.4 3.9 4.1  HGB 10.7* 9.9* 8.8* 9.5* 9.7*  HCT 31.2* 29.1* 26.1* 28.1* 29.3*  MCV 93.4 93.3 93.9 94.6 95.8  PLT 304 265 226 222 249   Cardiac Enzymes:  Recent Labs Lab 07/16/13 1920 07/16/13 2016 07/17/13 0237 07/17/13 0816  TROPONINI <0.30 <0.30 <0.30 <0.30   Recent Results (from the past 240 hour(s))  URINE CULTURE     Status: None   Collection Time    07/16/13  7:16 PM      Result Value Ref Range Status   Specimen Description URINE, CATHETERIZED   Final   Special Requests NONE   Final   Culture  Setup Time     Final   Value: 07/17/2013 21:42     Performed at [place]     Final   Value: 50,000 COLONIES/ML     Performed at [facility]   Culture     Final   Value: ESCHERICHIA COLI     Performed at [facility]   Report Status 07/19/2013 FINAL   Final   Organism ID, Bacteria ESCHERICHIA COLI   Final  CULTURE, BLOOD (ROUTINE X 2)     Status: None   Collection Time    07/16/13  8:16 PM      Result Value Ref Range Status   Specimen Description Blood LEFT ANTECUBITAL   Final   Special Requests BOTTLES DRAWN AEROBIC AND ANAEROBIC [place]   Final   Culture NO GROWTH 4 DAYS   Final   Report Status PENDING   Incomplete  CULTURE, BLOOD (ROUTINE X 2)     Status: None   Collection Time    07/16/13  8:21 PM      Result Value Ref Range Status    Specimen Description Blood BLOOD RIGHT HAND   Final   Special Requests BOTTLES DRAWN AEROBIC ONLY 5CC  Final   Culture NO GROWTH 4 DAYS   Final   Report Status PENDING   Incomplete     Scheduled Meds: . amLODipine  5 mg Oral q morning - 10a  . aspirin EC  81 mg Oral Daily  . atorvastatin  10 mg Oral q1800  . cefTRIAXone (ROCEPHIN)  IV  1 g Intravenous Q24H  . clopidogrel  75 mg Oral q morning - 10a  . docusate sodium  100 mg Oral QHS  . enoxaparin (LOVENOX) injection  30 mg Subcutaneous Q24H  . feeding supplement (ENSURE COMPLETE)  237 mL Oral BID BM  . metoprolol tartrate  25 mg Oral q morning - 10a  . mirtazapine  15 mg Oral QHS   Continuous Infusions:  Kristen Ramsay, MD  [facility] Pager [phone]  If 7PM-7AM, please contact night-coverage [email or web address] Password [email or web address] 07/20/2013, 4:04 PM   LOS: 4 days

## 2013-07-20 NOTE — Progress Notes (Signed)
Physical Therapy Treatment Patient Details Name: Kristen Rodriguez MRN: [id number] DOB: Sep 28, 1923 Today's Date: 08/11/13    History of Present Illness  Kristen Rodriguez is a 80 y.o. WF PMHx  Hx left breast cancer diagnosed 2004, S/P lumpectomy (negative XRT/chemotherapy), chronic pain, PVCs, chronic systolic and diastolic CHF, pulmonary hypertension, CAD, Hx posterior wall MI 10/2010, anemia, HLD, chronic right nephrolithiasis with frequent UTI. Presented to ED mental status change and fatigue. Urinalysis shows patient with UTI initial WBC= 14,000. Per daughter mental status change with increasing fatigue    PT Comments    Pt increased distance she is able to ambulate before fatiguing but is still significantly fatigued after 20 minutes of therapy.  Follow Up Recommendations  Home health PT     Equipment Recommendations  None recommended by PT       Precautions / Restrictions Precautions Precautions: None Restrictions Weight Bearing Restrictions: No    Mobility  Bed Mobility Overal bed mobility: Independent       Transfers Overall transfer level: Independent Equipment used: Rolling walker (2 wheeled)       Ambulation/Gait Ambulation/Gait assistance: Modified independent (Device/Increase time) Ambulation Distance (Feet): 140 Feet Assistive device: Rolling walker (2 wheeled) Gait Pattern/deviations: WFL(Within Functional Limits)   Gait velocity interpretation: at or above normal speed for age/gender           Cognition Arousal/Alertness: Awake/alert   Overall Cognitive Status: Within Functional Limits for tasks assessed               Exercises General Exercises - Lower Extremity Ankle Circles/Pumps: Both;10 reps Quad Sets: Both;10 reps Gluteal Sets: 10 reps (bridging) Hip ABduction/ADduction: Both;10 reps Straight Leg Raises: Both;10 reps        Pertinent Vitals/Pain 0/10       Prior Function  Lived I at home ambulated with a cane.         PT Goals  (current goals can now be found in the care plan section) Acute Rehab PT Goals Patient Stated Goal: Pt states she wants to go home she does not want to go to SNF Time For Goal Achievement: 07/21/13 Progress towards PT goals: Progressing toward goals    Frequency  Min 3X/week    PT Plan Current plan remains appropriate       End of Session Equipment Utilized During Treatment: Gait belt Activity Tolerance: Patient tolerated treatment well Patient left: in chair;with nursing/sitter in room;with chair alarm set     Time: 1610-1630 PT Time Calculation (min): 20 min  Charges:  $Gait Training: 8-22 mins                    G Codes:      Stefana Lodico,CINDY 2013/08/11, 4:34 PM

## 2013-07-21 LAB — CBC WITH DIFFERENTIAL/PLATELET
Basophils Absolute: 0.1 10*3/uL (ref 0.0–0.1)
Basophils Relative: 1 % (ref 0–1)
Eosinophils Absolute: 0.4 10*3/uL (ref 0.0–0.7)
Eosinophils Relative: 5 % (ref 0–5)
HCT: 32 % — ABNORMAL LOW (ref 36.0–46.0)
HEMOGLOBIN: 10.4 g/dL — AB (ref 12.0–15.0)
LYMPHS ABS: 2.5 10*3/uL (ref 0.7–4.0)
LYMPHS PCT: 34 % (ref 12–46)
MCH: 31.3 pg (ref 26.0–34.0)
MCHC: 32.5 g/dL (ref 30.0–36.0)
MCV: 96.4 fL (ref 78.0–100.0)
Monocytes Absolute: 0.6 10*3/uL (ref 0.1–1.0)
Monocytes Relative: 8 % (ref 3–12)
NEUTROS ABS: 3.9 10*3/uL (ref 1.7–7.7)
NEUTROS PCT: 53 % (ref 43–77)
PLATELETS: 301 10*3/uL (ref 150–400)
RBC: 3.32 MIL/uL — ABNORMAL LOW (ref 3.87–5.11)
RDW: 13.8 % (ref 11.5–15.5)
WBC: 7.4 10*3/uL (ref 4.0–10.5)

## 2013-07-21 LAB — COMPREHENSIVE METABOLIC PANEL
ALT: 17 U/L (ref 0–35)
AST: 27 U/L (ref 0–37)
Albumin: 2.5 g/dL — ABNORMAL LOW (ref 3.5–5.2)
Alkaline Phosphatase: 139 U/L — ABNORMAL HIGH (ref 39–117)
BUN: 6 mg/dL (ref 6–23)
CO2: 24 meq/L (ref 19–32)
CREATININE: 0.91 mg/dL (ref 0.50–1.10)
Calcium: 7.6 mg/dL — ABNORMAL LOW (ref 8.4–10.5)
Chloride: 105 mEq/L (ref 96–112)
GFR, EST AFRICAN AMERICAN: 64 mL/min — AB (ref >90)
GFR, EST NON AFRICAN AMERICAN: 55 mL/min — AB (ref >90)
GLUCOSE: 89 mg/dL (ref 70–99)
Potassium: 5.1 mEq/L (ref 3.7–5.3)
Sodium: 139 mEq/L (ref 137–147)
Total Bilirubin: 0.3 mg/dL (ref 0.3–1.2)
Total Protein: 6.3 g/dL (ref 6.0–8.3)

## 2013-07-21 LAB — CULTURE, BLOOD (ROUTINE X 2)
CULTURE: NO GROWTH
Culture: NO GROWTH

## 2013-07-21 LAB — MAGNESIUM: MAGNESIUM: 1.8 mg/dL (ref 1.5–2.5)

## 2013-07-21 MED ORDER — CIPROFLOXACIN HCL 250 MG PO TABS: 250.0000 mg | ORAL_TABLET | Freq: Two times a day (BID) | ORAL | Status: DC

## 2013-07-21 NOTE — Discharge Summary (Signed)
Physician Discharge Summary  Kristen Rodriguez:[id number] DOB: [birth year] DOA: 07/16/2013  PCP: Octavio Graves, DO  Admit date: 07/16/2013 Discharge date: 07/21/2013  Time spent: 40 minutes  Recommendations for Outpatient Follow-up:  1. Patient has been set up with home health services. Initially, placement in a skilled nursing facility was recommended, but patient declined. 2. Follow up with primary care physician in one to 2 weeks  Discharge Diagnoses:  Principal Problem:   UTI (lower urinary tract infection) Active Problems:   CAD   PVCs (premature ventricular contractions)   Anemia   Ejection fraction < 50%   HLD (hyperlipidemia)   Hypokalemia   Chronic combined systolic and diastolic CHF (congestive heart failure)   Pulmonary hypertension   Mental status change   Discharge Condition: improved  Diet recommendation: heart healthy  Filed Weights   07/19/13 0612 07/20/13 0555 07/21/13 0540  Weight: 43.681 kg (96 lb 4.8 oz) 42.185 kg (93 lb) 42.003 kg (92 lb 9.6 oz)    History of present illness:  Kristen Rodriguez is a 80 y.o. WF PMHx Hx left breast cancer diagnosed 2004, S/P lumpectomy (negative XRT/chemotherapy), chronic pain, PVCs, chronic systolic and diastolic CHF, pulmonary hypertension, CAD, Hx posterior wall MI 10/2010, anemia, HLD, chronic right nephrolithiasis with frequent UTI. Presented to ED mental status change and fatigue. Urinalysis shows patient with UTI initial WBC= 14,000. Per daughter mental status change with increasing fatigue   Hospital Course:  This patient was admitted to the hospital with increasing confusion/mental status change as well as worsening fatigue. She was found to have a urinary tract infection with Escherichia coli. She was started on intravenous antibiotics and subsequently transitioned to by mouth antibiotics to complete her course. The patient has been afebrile her mental status has improved back to baseline. She was also noted to be severely  hypokalemic on admission with a potassium of 2.2. This has been corrected. She was noted to be generally weak when evaluated by physical therapy and recommendations were for short stay at a skilled nursing facility for physical rehabilitation. The patient has declined and wishes to go home. She is being set up with home health physical therapy, nursing and an aide. This was explained to the patient and her daughter. They are in agreement. She should follow up with her primary care physician in one to 2 weeks. No other changes in her outpatient medications were made.  Procedures:    Consultations:     Discharge Exam: Filed Vitals:   07/21/13 1328  BP: 126/71  Pulse: 109  Temp:   Resp:     General: No acute distress Cardiovascular: S1, S2, regular rate and rhythm Respiratory: Clear to auscultation bilaterally  Discharge Instructions You were cared for by a hospitalist during your hospital stay. If you have any questions about your discharge medications or the care you received while you were in the hospital after you are discharged, you can call the unit and asked to speak with the hospitalist on call if the hospitalist that took care of you is not available. Once you are discharged, your primary care physician will handle any further medical issues. Please note that NO REFILLS for any discharge medications will be authorized once you are discharged, as it is imperative that you return to your primary care physician (or establish a relationship with a primary care physician if you do not have one) for your aftercare needs so that they can reassess your need for medications and monitor your lab values.  Discharge Orders   Future Orders Complete By Expires   Call MD for:  extreme fatigue  As directed    Call MD for:  extreme fatigue  As directed    Call MD for:  persistant dizziness or light-headedness  As directed    Call MD for:  persistant dizziness or light-headedness  As directed     Call MD for:  temperature >100.4  As directed    Call MD for:  temperature >100.4  As directed    Diet - low sodium heart healthy  As directed    Diet - low sodium heart healthy  As directed    Face-to-face encounter (required for Medicare/Medicaid patients)  As directed    Questions:     The encounter with the patient was in whole, or in part, for the following medical condition, which is the primary reason for home health care:  urinary tract infection   I certify that, based on my findings, the following services are medically necessary home health services:  Nursing   Physical therapy   My clinical findings support the need for the above services:  Unsafe ambulation due to balance issues   Further, I certify that my clinical findings support that this patient is homebound due to:  Unable to leave home safely without assistance   Reason for Medically Necessary Home Health Services:  [place]  As directed    Questions:     To provide the following care/treatments:  PT   RN   Home Health Aide   Increase activity slowly  As directed    Increase activity slowly  As directed        Medication List         amLODipine 5 MG tablet  Commonly known as:  NORVASC  Take 5 mg by mouth every morning.     aspirin 81 MG tablet  Take 81 mg by mouth every morning.     calcium-vitamin D 500-200 MG-UNIT per tablet  Commonly known as:  OSCAL WITH D  Take 1 tablet by mouth every morning.     ciprofloxacin 250 MG tablet  Commonly known as:  CIPRO  Take 1 tablet (250 mg total) by mouth 2 (two) times daily.     clopidogrel 75 MG tablet  Commonly known as:  PLAVIX  Take 75 mg by mouth every morning.     docusate sodium 100 MG capsule  Commonly known as:  COLACE  Take 100 mg by mouth at bedtime.     HYDROcodone-acetaminophen 5-325 MG per tablet  Commonly known as:  NORCO/VICODIN  Take 1 tablet by mouth every 6 (six) hours as needed for  moderate pain.     metoprolol tartrate 25 MG tablet  Commonly known as:  LOPRESSOR  Take 25 mg by mouth every morning.     mirtazapine 15 MG tablet  Commonly known as:  REMERON  Take 15 mg by mouth at bedtime.     nitroGLYCERIN 0.4 MG SL tablet  Commonly known as:  NITROSTAT  Place 0.4 mg under the tongue every 5 (five) minutes as needed for chest pain.     OMEGA 3 PO  Take 1 capsule by mouth every morning.     PRESERVISION AREDS PO  Take 1 tablet by mouth daily.     multivitamin-lutein Caps capsule  Take 1 capsule by mouth daily.       Allergies  Allergen Reactions  . Codeine Nausea  Only  . Penicillins Other (See Comments)    unknown  . Sulfa Antibiotics Nausea Only       Follow-up Information   Follow up with [facility] [place]. [facility] RN, PT and an Aide)    Contact information:   [address] [place] [address] [phone]      Follow up with [place], [place], DO. Schedule an appointment as soon as possible for a visit in 2 weeks.   Contact information:   [address] [place] [address] [phone]        The results of significant diagnostics from this hospitalization (including imaging, microbiology, ancillary and laboratory) are listed below for reference.    Significant Diagnostic Studies: Ct Head Wo Contrast  07/17/2013   CLINICAL DATA:  Found down  EXAM: CT HEAD WITHOUT CONTRAST  TECHNIQUE: Contiguous axial images were obtained from the base of the skull through the vertex without intravenous contrast.  COMPARISON:  Prior CT from 06/02/2012  FINDINGS: Advanced age-related atrophy with chronic microvascular ischemic disease is stable relative to prior exam. Prominent vascular calcifications noted within the carotid siphons and distal vertebral arteries.  There is no acute intracranial hemorrhage or infarct. No mass lesion or midline shift. Gray-white matter differentiation is well maintained. Ventricles are normal in size without  evidence of hydrocephalus. No extra-axial fluid collection.  The calvarium is intact.  Orbital soft tissues are within normal limits. Bilateral lens implants noted.  The paranasal sinuses and mastoid air cells are well pneumatized and free of fluid.  Scalp soft tissues are unremarkable.  IMPRESSION: 1. No acute intracranial process identified. 2. Advanced age-related atrophy with chronic microvascular ischemic disease, similar to prior.   Electronically Signed   By: Jeannine Boga M.D.   On: 07/17/2013 04:22   Dg Chest Port 1 View  07/16/2013   CLINICAL DATA:  Elevated WBC  EXAM: PORTABLE CHEST - 1 VIEW  COMPARISON:  11/30/2011  FINDINGS: Cardiomediastinal silhouette is stable. No acute infiltrate or pleural effusion. No pulmonary edema. Degenerative changes thoracic spine.  IMPRESSION: No active disease.   Electronically Signed   By: Lahoma Crocker M.D.   On: 07/16/2013 19:32    Microbiology: Recent Results (from the past 240 hour(s))  URINE CULTURE     Status: None   Collection Time    07/16/13  7:16 PM      Result Value Ref Range Status   Specimen Description URINE, CATHETERIZED   Final   Special Requests NONE   Final   Culture  Setup Time     Final   Value: 07/17/2013 21:42     Performed at [place]     Final   Value: 50,000 COLONIES/ML     Performed at [facility]   Culture     Final   Value: ESCHERICHIA COLI     Performed at [facility]   Report Status 07/19/2013 FINAL   Final   Organism ID, Bacteria ESCHERICHIA COLI   Final  CULTURE, BLOOD (ROUTINE X 2)     Status: None   Collection Time    07/16/13  8:16 PM      Result Value Ref Range Status   Specimen Description Blood LEFT ANTECUBITAL   Final   Special Requests BOTTLES DRAWN AEROBIC AND ANAEROBIC [place]   Final   Culture NO GROWTH 5 DAYS   Final   Report Status 07/21/2013 FINAL   Final  CULTURE, BLOOD (ROUTINE  X 2)     Status: None   Collection Time    07/16/13  8:21 PM       Result Value Ref Range Status   Specimen Description Blood BLOOD RIGHT HAND   Final   Special Requests BOTTLES DRAWN AEROBIC ONLY 5CC   Final   Culture NO GROWTH 5 DAYS   Final   Report Status 07/21/2013 FINAL   Final     Labs: Basic Metabolic Panel:  Recent Labs Lab 07/17/13 1745 07/18/13 0441 07/19/13 0524 07/20/13 0523 07/20/13 1731 07/21/13 0516  NA  --  140 141 137 136* 139  K 3.7 3.7 2.9* 6.0* 5.8* 5.1  CL  --  105 102 104 102 105  CO2  --  26 28 25 23 24   GLUCOSE  --  103* 94 86 141* 89  BUN  --  7 7 6 7 6   CREATININE  --  0.94 0.96 0.89 0.83 0.91  CALCIUM  --  7.8* 7.4* 7.2* 7.3* 7.6*  MG 1.9 1.6 1.5 1.9  --  1.8   Liver Function Tests:  Recent Labs Lab 07/17/13 0816 07/18/13 0441 07/19/13 0524 07/20/13 0523 07/21/13 0516  AST 19 21 18 23 27   ALT 11 11 11 13 17   ALKPHOS 100 89 90 107 139*  BILITOT 0.5 0.2* 0.2* 0.2* 0.3  PROT 5.8* 5.3* 5.4* 5.8* 6.3  ALBUMIN 2.4* 2.1* 2.1* 2.2* 2.5*   No results found for this basename: LIPASE, AMYLASE,  in the last 168 hours No results found for this basename: AMMONIA,  in the last 168 hours CBC:  Recent Labs Lab 07/17/13 0816 07/18/13 0441 07/19/13 0524 07/20/13 0523 07/21/13 0516  WBC 10.0 6.7 7.2 7.2 7.4  NEUTROABS 7.1 4.4 3.9 4.1 3.9  HGB 9.9* 8.8* 9.5* 9.7* 10.4*  HCT 29.1* 26.1* 28.1* 29.3* 32.0*  MCV 93.3 93.9 94.6 95.8 96.4  PLT 265 226 222 249 301   Cardiac Enzymes:  Recent Labs Lab 07/16/13 1920 07/16/13 2016 07/17/13 0237 07/17/13 0816  TROPONINI <0.30 <0.30 <0.30 <0.30   BNP: BNP (last 3 results)  Recent Labs  07/16/13 2016  PROBNP 2370.0*   CBG: No results found for this basename: GLUCAP,  in the last 168 hours     Signed:  Kathie Dike  [facility] 07/21/2013, 8:17 PM

## 2013-07-21 NOTE — Progress Notes (Signed)
Pt discharged with instructions.  Patients daughter verbalized understanding.  The patient left the floor via w/c with staff in stable condition.  No further questions and concerns voiced at this time.

## 2013-07-21 NOTE — Discharge Instructions (Signed)
Urinary Tract Infection  Urinary tract infections (UTIs) can develop anywhere along your urinary tract. Your urinary tract is your body's drainage system for removing wastes and extra water. Your urinary tract includes two kidneys, two ureters, a bladder, and a urethra. Your kidneys are a pair of bean-shaped organs. Each kidney is about the size of your fist. They are located below your ribs, one on each side of your spine.  CAUSES  Infections are caused by microbes, which are microscopic organisms, including fungi, viruses, and bacteria. These organisms are so small that they can only be seen through a microscope. Bacteria are the microbes that most commonly cause UTIs.  SYMPTOMS   Symptoms of UTIs may vary by age and gender of the patient and by the location of the infection. Symptoms in young women typically include a frequent and intense urge to urinate and a painful, burning feeling in the bladder or urethra during urination. Older women and men are more likely to be tired, shaky, and weak and have muscle aches and abdominal pain. A fever may mean the infection is in your kidneys. Other symptoms of a kidney infection include pain in your back or sides below the ribs, nausea, and vomiting.  DIAGNOSIS  To diagnose a UTI, your caregiver will ask you about your symptoms. Your caregiver also will ask to provide a urine sample. The urine sample will be tested for bacteria and white blood cells. White blood cells are made by your body to help fight infection.  TREATMENT   Typically, UTIs can be treated with medication. Because most UTIs are caused by a bacterial infection, they usually can be treated with the use of antibiotics. The choice of antibiotic and length of treatment depend on your symptoms and the type of bacteria causing your infection.  HOME CARE INSTRUCTIONS   If you were prescribed antibiotics, take them exactly as your caregiver instructs you. Finish the medication even if you feel better after you  have only taken some of the medication.   Drink enough water and fluids to keep your urine clear or pale yellow.   Avoid caffeine, tea, and carbonated beverages. They tend to irritate your bladder.   Empty your bladder often. Avoid holding urine for long periods of time.   Empty your bladder before and after sexual intercourse.   After a bowel movement, women should cleanse from front to back. Use each tissue only once.  SEEK MEDICAL CARE IF:    You have back pain.   You develop a fever.   Your symptoms do not begin to resolve within 3 days.  SEEK IMMEDIATE MEDICAL CARE IF:    You have severe back pain or lower abdominal pain.   You develop chills.   You have nausea or vomiting.   You have continued burning or discomfort with urination.  MAKE SURE YOU:    Understand these instructions.   Will watch your condition.   Will get help right away if you are not doing well or get worse.  Document Released: 01/09/2005 Document Revised: 10/01/2011 Document Reviewed: 05/10/2011  ExitCare Patient Information 2014 [employer].

## 2013-07-26 NOTE — Progress Notes (Signed)
UR chart review completed.  

## 2013-09-09 ENCOUNTER — Telehealth: Payer: Self-pay | Admitting: Cardiovascular Disease

## 2013-09-09 NOTE — Telephone Encounter (Signed)
Discussed issue with daughter [name]).  Stated that she did see her PMD 4-5 weeks ago & was put on Cipro for UTI x 1 week.  Went back for recheck of u/a & culture by PMD.  Was told by Dr. Melina Copa on 5/19 that urine culture did not grow anything so she did not need further antibiotics.  She has not been on any ABO in 5 weeks.  Stated patient is not on supplemental Potassium currently.  Thyroid was checked during last hospital stay and was normal.  Daughter states that she is just not acting like her self.  No c/o any pain, dizziness, nausea, palpitations.  Daughter stated that she has not been feeling well so she has not been able to stay with her in her home recently.  Says she keeps the house about 86 degrees all the time.  Also, stated that she drove herself to her house at 6 am & left before she could get to door.  [name] followed her back to her house to make sure she made it back home okay.  When she discussed with mom later that evening, she was confused about the time & how long it took her to get from place to place.  Stated that she does drive occasionally, but typically not that early & usually just to pick up something to eat.  Daughter very concerned.  Informed message will be sent to provider for advice.  [name] verbalized understanding.

## 2013-09-09 NOTE — Telephone Encounter (Signed)
[name] (daughter) thinks that her mother's potassium is low again. Patient seems to be confused staying cold (wanting to run the heat) Family is wanting to know if Dr. Bronson Ing would order labs on mother.

## 2013-09-13 NOTE — Telephone Encounter (Signed)
Does not sound cardiac in etiology. Would have her f/u with PCP for further evaluation. Perhaps a referral to neurology. Will defer to PCP.

## 2013-09-16 NOTE — Telephone Encounter (Signed)
Left message to return call 

## 2013-09-17 ENCOUNTER — Other Ambulatory Visit: Payer: Self-pay | Admitting: Cardiovascular Disease

## 2013-09-17 NOTE — Telephone Encounter (Signed)
Left message to return call 

## 2013-09-22 NOTE — Telephone Encounter (Signed)
No return call received at present.  Call placed to PMD Melina Copa) to see if she followed up there.  Patient was seen on 08/24/2013 for recurrent UTI.

## 2013-12-20 ENCOUNTER — Other Ambulatory Visit: Payer: Self-pay | Admitting: Cardiovascular Disease

## 2014-02-08 ENCOUNTER — Ambulatory Visit: Payer: 59 | Admitting: Cardiovascular Disease

## 2014-02-13 IMAGING — CR DG CHEST 1V PORT
1 series · 1 of 1 positions shown · non-contrast
Comparison: 10/08/2011.

CLINICAL DATA: Shortness of breath.

PORTABLE CHEST - 1 VIEW

[AP]
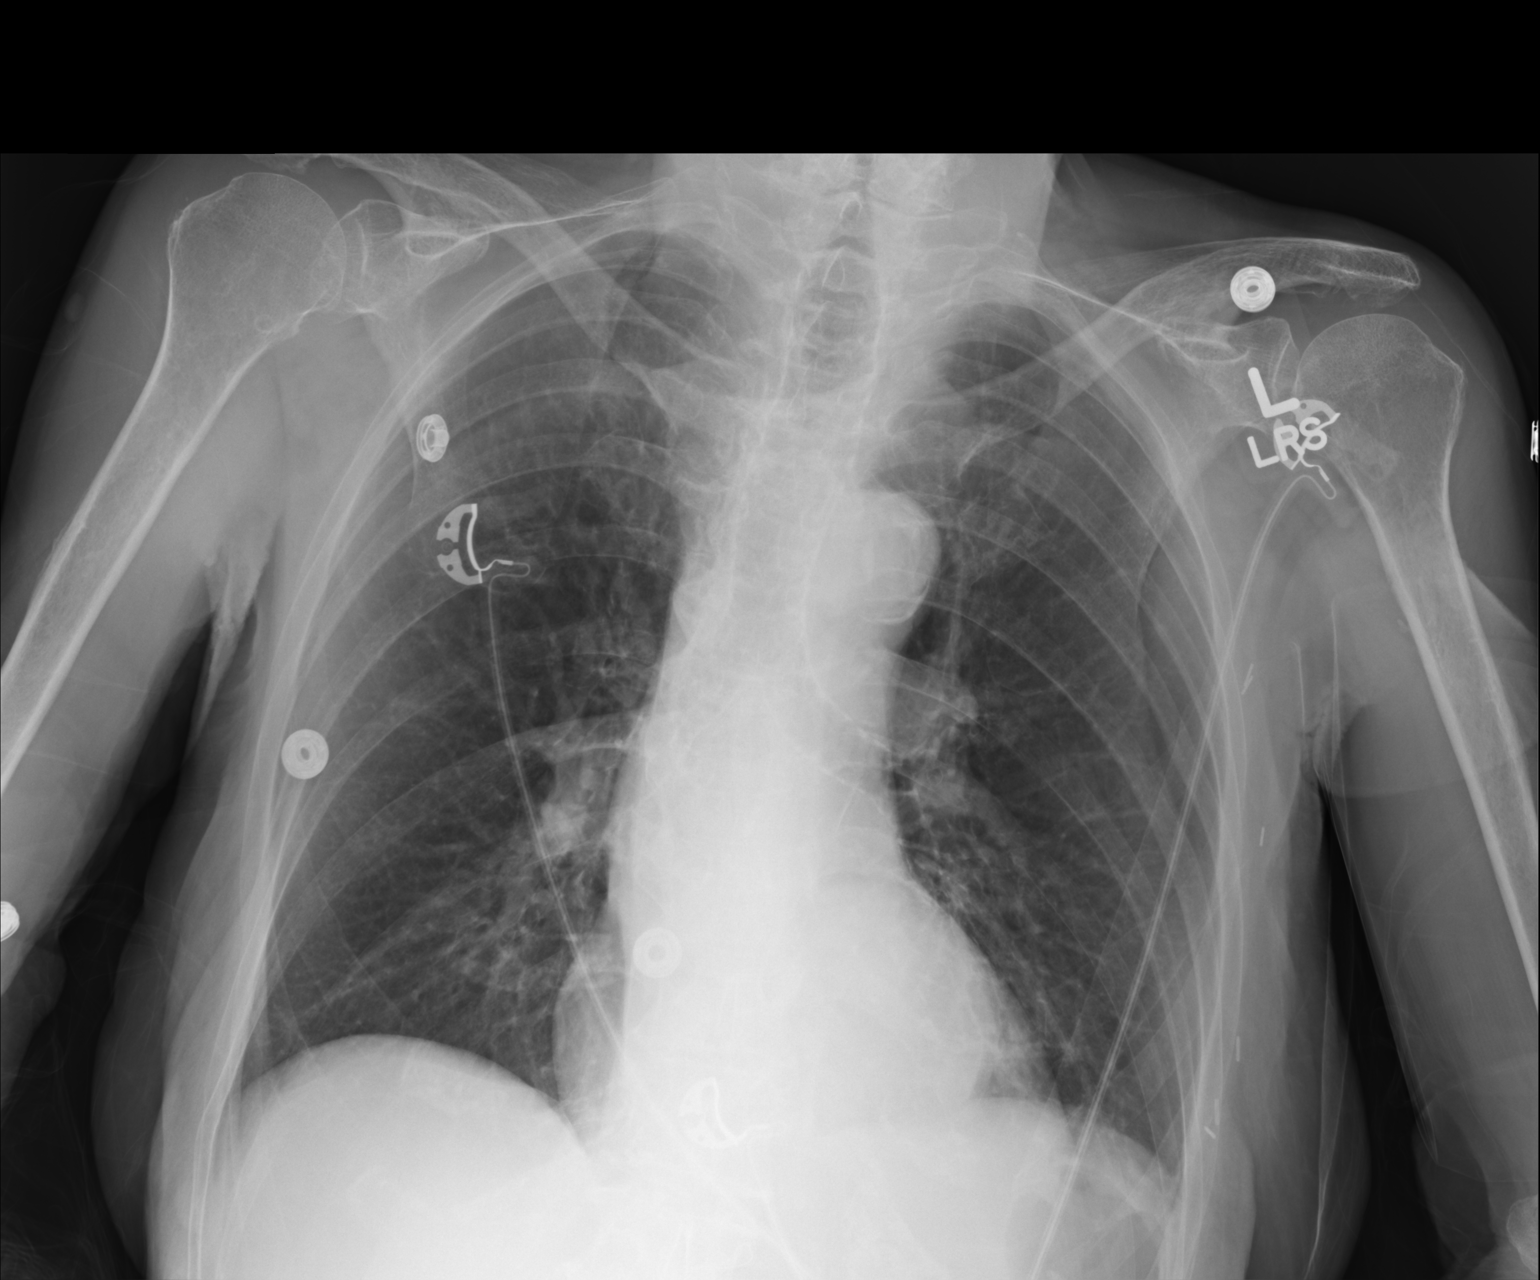

[1 of 1 positions shown; findings below may reference images not displayed]

FINDINGS: Trachea is midline.  Heart size normal.  Hiatal hernia is
noted.  Atherosclerotic calcification is seen in the aorta.
Minimal left basilar atelectasis.  Lungs are otherwise clear.  No
pleural fluid.
IMPRESSION: Minimal basilar atelectasis.

## 2014-03-24 ENCOUNTER — Encounter (HOSPITAL_COMMUNITY): Payer: Self-pay | Admitting: Cardiology

## 2014-04-22 ENCOUNTER — Other Ambulatory Visit: Payer: Self-pay | Admitting: Cardiovascular Disease

## 2014-05-25 ENCOUNTER — Other Ambulatory Visit: Payer: Self-pay | Admitting: Cardiovascular Disease

## 2014-07-13 ENCOUNTER — Ambulatory Visit (INDEPENDENT_AMBULATORY_CARE_PROVIDER_SITE_OTHER): Payer: Medicare Other | Admitting: Cardiovascular Disease

## 2014-07-13 ENCOUNTER — Encounter: Payer: Self-pay | Admitting: Cardiovascular Disease

## 2014-07-13 VITALS — BP 128/69 | HR 82 | Ht 62.0 in | Wt 89.0 lb

## 2014-07-13 DIAGNOSIS — R740 Nonspecific elevation of levels of transaminase and lactic acid dehydrogenase [LDH]: Secondary | ICD-10-CM

## 2014-07-13 DIAGNOSIS — I2510 Atherosclerotic heart disease of native coronary artery without angina pectoris: Secondary | ICD-10-CM | POA: Diagnosis not present

## 2014-07-13 DIAGNOSIS — I25118 Atherosclerotic heart disease of native coronary artery with other forms of angina pectoris: Secondary | ICD-10-CM

## 2014-07-13 DIAGNOSIS — Z955 Presence of coronary angioplasty implant and graft: Secondary | ICD-10-CM

## 2014-07-13 DIAGNOSIS — R079 Chest pain, unspecified: Secondary | ICD-10-CM

## 2014-07-13 DIAGNOSIS — E782 Mixed hyperlipidemia: Secondary | ICD-10-CM

## 2014-07-13 DIAGNOSIS — I10 Essential (primary) hypertension: Secondary | ICD-10-CM

## 2014-07-13 DIAGNOSIS — R7401 Elevation of levels of liver transaminase levels: Secondary | ICD-10-CM

## 2014-07-13 MED ORDER — ISOSORBIDE MONONITRATE ER 60 MG PO TB24: 60.0000 mg | ORAL_TABLET | Freq: Every day | ORAL | Status: DC

## 2014-07-13 NOTE — Patient Instructions (Signed)
Your physician wants you to follow-up in: [place]. Bronson Ing You will receive a reminder letter in the mail two months in advance. If you don't receive a letter, please call our office to schedule the follow-up appointment.  Your physician has recommended you make the following change in your medication:   INCREASE IMDUR 60 MG DAILY  STOP PLAVIX  CONTINUE ALL OTHER MEDICATIONS AS DIRECTED  Thank you for choosing [facility]!!

## 2014-07-13 NOTE — Progress Notes (Signed)
Patient ID: Kristen Rodriguez, female   DOB: 1924-01-27, 81 y.o.   MRN: [id number]      SUBJECTIVE: The patient returns for follow-up of coronary artery disease. She sustained an inferior MI in 2013. Coronary angiography on 10/08/11 demonstrated a calcified left main coronary artery with 20% distal narrowing, heavy calcification of the LAD with 40% stenosis before the diagonal take off and 50% segmented total stenosis thereafter. The diagonal was a large vessel with 50% proximal segmental plaque. The left circumflex was small with 30-50% segmental plaque proximally.   The RCA was a large caliber vessel with heavy calcification and an 80% proximal focal lesion which was stented. The mid vessel was heavily calcified with diffuse 50-60% calcified narrowing and did not appear favorable for intervention. A small acute marginal branch had a 90% stenosis. The first PDA branch was small and without narrowing. The second PDA was a medium size vessel and had an 80% proximal and 90% mid vessel stenosis for which she underwent PTCA.  Echocardiogram on 10/09/11 demonstrated mildly reduced left ventricular systolic function EF 61-95%, with mild hypokinesis of the entire inferior myocardium with grade 1 diastolic dysfunction.  She was reportedly hospitalized at [facility] on 04/13/14 for chest pain. Her daughter tells me that she ruled out for an acute coronary syndrome. She said a chest CT was performed to evaluate for blood clots and was negative. She was started on Imdur 30 mg at that time. She now experiences chest pain sometimes 2-3 times per week located in the left precordial region.    Review of Systems: As per "subjective", otherwise negative.  Allergies  Allergen Reactions  . Codeine Nausea Only  . Penicillins Other (See Comments)    unknown  . Sulfa Antibiotics Nausea Only    Current Outpatient Prescriptions  Medication Sig Dispense Refill  . amLODipine (NORVASC) 5 MG tablet Take 5 mg by mouth every  morning.     Marland Kitchen aspirin 81 MG tablet Take 81 mg by mouth every morning.     . calcium-vitamin D (OSCAL WITH D) 500-200 MG-UNIT per tablet Take 1 tablet by mouth every morning.     . clopidogrel (PLAVIX) 75 MG tablet Take 75 mg by mouth every morning.     . docusate sodium (COLACE) 100 MG capsule Take 100 mg by mouth at bedtime.     . isosorbide mononitrate (IMDUR) 30 MG 24 hr tablet Take 1 tablet by mouth every morning.    . metoprolol tartrate (LOPRESSOR) 25 MG tablet Take 25 mg by mouth every morning.     . mirtazapine (REMERON) 15 MG tablet Take 15 mg by mouth at bedtime.    . Multiple Vitamins-Minerals (PRESERVISION AREDS PO) Take 1 tablet by mouth daily.    . nitroGLYCERIN (NITROSTAT) 0.4 MG SL tablet Place 0.4 mg under the tongue every 5 (five) minutes as needed for chest pain.    . Omega-3 Fatty Acids (OMEGA 3 PO) Take 1 capsule by mouth every morning.    . polyethylene glycol powder (GLYCOLAX/MIRALAX) powder Take 17 g by mouth daily.    . potassium chloride (K-DUR,KLOR-CON) 10 MEQ tablet Take 1 tablet by mouth at bedtime.    . sulfamethoxazole-trimethoprim (BACTRIM,SEPTRA) 400-80 MG per tablet Take 1 tablet by mouth at bedtime.     No current facility-administered medications for this visit.    Past Medical History  Diagnosis Date  . Precordial pain   . Old myocardial infarction   . Hypopotassemia   . Esophageal reflux   .  Allergic rhinitis, cause unspecified   . Pure hypercholesterolemia   . Unspecified essential hypertension   . Family history of ischemic heart disease   . CAD (coronary artery disease) 07/2000    Status Post NSTEMI, 60% mid RCA lesion treated medically  . Breast cancer 2006  . DVT (deep venous thrombosis) 2006  . Macular degeneration   . CHF (congestive heart failure)   . H/O hiatal hernia     Past Surgical History  Procedure Laterality Date  . Lumbar laminectomy    . Cataract extraction, bilateral    . Abdominal hysterectomy    . Corneal implant        left  . Cardiac catheterization  2002    Without PCI  . Cardiac catheterization  10/08/2011    1.  Inferior MI with occlusion of the PLA with reperfusion with POBA, stent placed proximally RCA  for high grade disease  . Total hip arthroplasty  02/2010    Right hip by Nilda Calamity Polmeritis    . Colonoscopy  09/17/2005    Tubular adenoma fragments  . Femur im nail  12/01/2011    Procedure: INTRAMEDULLARY (IM) NAIL FEMORAL;  Surgeon: Johnn Hai, MD;  Location: [facility];  Service: Orthopedics;  Laterality: Left;  left hipintertrochanteric nail  (Biomet)  . Left heart catheterization with coronary angiogram N/A 10/08/2011    Procedure: LEFT HEART CATHETERIZATION WITH CORONARY ANGIOGRAM;  Surgeon: Hillary Bow, MD;  Location: [facility] CATH LAB;  Service: Cardiovascular;  Laterality: N/A;    History   Social History  . Marital Status: Widowed    Spouse Name: N/A  . Number of Children: N/A  . Years of Education: N/A   Occupational History  . RETIRED    Social History Main Topics  . Smoking status: Never Smoker   . Smokeless tobacco: Never Used  . Alcohol Use: No  . Drug Use: No  . Sexual Activity: No   Other Topics Concern  . Not on file   Social History Narrative     Filed Vitals:   07/13/14 1515  BP: 128/69  Pulse: 82  Height: 5\' 2"  (1.575 m)  Weight: 89 lb (40.37 kg)    PHYSICAL EXAM General: NAD, elderly, frail, thin build Neck: No JVD, no thyromegaly. Lungs: Clear to auscultation bilaterally with normal respiratory effort. CV: Nondisplaced PMI. Regular rate and rhythm, normal S1/S2, no S3/S4, soft I/VI pansystolic murmur along left sternal border. No pretibial or periankle edema. Abdomen: Soft, nontender, no distention.  Neurologic: Alert and oriented.  Psych: Normal affect. Skin: Normal. Musculoskeletal: No gross deformities. Extremities: No clubbing or cyanosis.   ECG: Most recent ECG reviewed.      ASSESSMENT AND PLAN: 1. CAD: Relatively stable  ischemic heart disease. Will increase Imdur to 60 mg daily to help alleviate chest pain. Will d/c Plavix. Will continue metoprolol. Given prior h/o elevated liver transaminases and due to age, will avoid statin therapy. 2. Essential HTN: Well controlled. No changes. 3. Ischemic cardiomyopathy, EF 40-45%: No diuretic requirement at this time.  Dispo: f/u 6 months.  Time spent: 40 minutes, of which greater than 50% was spent reviewing symptoms, relevant blood tests and studies, and discussing management plan with the patient.   Kate Sable, M.D., F.A.C.C.

## 2014-08-22 ENCOUNTER — Other Ambulatory Visit: Payer: Self-pay | Admitting: Cardiovascular Disease

## 2015-01-25 ENCOUNTER — Ambulatory Visit (INDEPENDENT_AMBULATORY_CARE_PROVIDER_SITE_OTHER): Payer: Medicare Other | Admitting: Cardiovascular Disease

## 2015-01-25 ENCOUNTER — Encounter: Payer: Self-pay | Admitting: Cardiovascular Disease

## 2015-01-25 VITALS — BP 144/81 | HR 75 | Ht 62.0 in | Wt 92.0 lb

## 2015-01-25 DIAGNOSIS — Z955 Presence of coronary angioplasty implant and graft: Secondary | ICD-10-CM | POA: Diagnosis not present

## 2015-01-25 DIAGNOSIS — I25118 Atherosclerotic heart disease of native coronary artery with other forms of angina pectoris: Secondary | ICD-10-CM

## 2015-01-25 DIAGNOSIS — E782 Mixed hyperlipidemia: Secondary | ICD-10-CM

## 2015-01-25 DIAGNOSIS — I10 Essential (primary) hypertension: Secondary | ICD-10-CM | POA: Diagnosis not present

## 2015-01-25 DIAGNOSIS — I519 Heart disease, unspecified: Secondary | ICD-10-CM

## 2015-01-25 DIAGNOSIS — R252 Cramp and spasm: Secondary | ICD-10-CM

## 2015-01-25 NOTE — Patient Instructions (Signed)
Continue all current medications. Your physician wants you to follow up in: 6 months.  You will receive a reminder letter in the mail one-two months in advance.  If you don't receive a letter, please call our office to schedule the follow up appointment   

## 2015-01-25 NOTE — Progress Notes (Signed)
Patient ID: Kristen Rodriguez, female   DOB: Feb 25, 1924, 81 y.o.   MRN: [id number]      SUBJECTIVE: The patient returns for follow-up of coronary artery disease. She sustained an inferior MI in 2013. Coronary angiography on 10/08/11 demonstrated a calcified left main coronary artery with 20% distal narrowing, heavy calcification of the LAD with 40% stenosis before the diagonal take off and 50% segmented total stenosis thereafter. The diagonal was a large vessel with 50% proximal segmental plaque. The left circumflex was small with 30-50% segmental plaque proximally.   The RCA was a large caliber vessel with heavy calcification and an 80% proximal focal lesion which was stented. The mid vessel was heavily calcified with diffuse 50-60% calcified narrowing and did not appear favorable for intervention. A small acute marginal branch had a 90% stenosis. The first PDA branch was small and without narrowing. The second PDA was a medium size vessel and had an 80% proximal and 90% mid vessel stenosis for which she underwent PTCA.  Echocardiogram on 10/09/11 demonstrated mildly reduced left ventricular systolic function EF 92-33%, with mild hypokinesis of the entire inferior myocardium with grade 1 diastolic dysfunction.  I increased Imdur to 60 mg at her last visit, and she is no longer complaining of chest pain.  She is here with her sitter, Ivin Booty, who stays with her 12-8 pm Mon-Fri. The patient's biggest complaint if bilateral feet cramping. She is now taking magnesium which has helped.   Review of Systems: As per "subjective", otherwise negative.  Allergies  Allergen Reactions  . Codeine Nausea Only  . Penicillins Other (See Comments)    unknown  . Sulfa Antibiotics Nausea Only    Current Outpatient Prescriptions  Medication Sig Dispense Refill  . amLODipine (NORVASC) 5 MG tablet Take 5 mg by mouth every morning.     Marland Kitchen aspirin 81 MG tablet Take 81 mg by mouth every morning.     . calcium-vitamin D  (OSCAL WITH D) 500-200 MG-UNIT per tablet Take 1 tablet by mouth every morning.     . docusate sodium (COLACE) 100 MG capsule Take 100 mg by mouth at bedtime.     . isosorbide mononitrate (IMDUR) 60 MG 24 hr tablet Take 1 tablet (60 mg total) by mouth daily. 90 tablet 3  . Magnesium 250 MG TABS Take 1 tablet by mouth every other day.    . metoprolol tartrate (LOPRESSOR) 25 MG tablet Take 25 mg by mouth every morning.     . mirtazapine (REMERON) 15 MG tablet Take 15 mg by mouth at bedtime.    . nitroGLYCERIN (NITROSTAT) 0.4 MG SL tablet Place 0.4 mg under the tongue every 5 (five) minutes as needed for chest pain.    . polyethylene glycol powder (GLYCOLAX/MIRALAX) powder Take 17 g by mouth daily.    . potassium chloride (K-DUR,KLOR-CON) 10 MEQ tablet Take 1 tablet by mouth at bedtime.    . sulfamethoxazole-trimethoprim (BACTRIM,SEPTRA) 400-80 MG per tablet Take 1 tablet by mouth at bedtime.     No current facility-administered medications for this visit.    Past Medical History  Diagnosis Date  . Precordial pain   . Old myocardial infarction   . Hypopotassemia   . Esophageal reflux   . Allergic rhinitis, cause unspecified   . Pure hypercholesterolemia   . Unspecified essential hypertension   . Family history of ischemic heart disease   . CAD (coronary artery disease) 07/2000    Status Post NSTEMI, 60% mid RCA lesion treated medically  .  Breast cancer ([place]) 2006  . DVT (deep venous thrombosis) ([place]) 2006  . Macular degeneration   . CHF (congestive heart failure) ([place])   . H/O hiatal hernia     Past Surgical History  Procedure Laterality Date  . Lumbar laminectomy    . Cataract extraction, bilateral    . Abdominal hysterectomy    . Corneal implant      left  . Cardiac catheterization  2002    Without PCI  . Cardiac catheterization  10/08/2011    1.  Inferior MI with occlusion of the PLA with reperfusion with POBA, stent placed proximally RCA  for high grade disease  . Total hip  arthroplasty  02/2010    Right hip by Nilda Calamity Polmeritis    . Colonoscopy  09/17/2005    Tubular adenoma fragments  . Femur im nail  12/01/2011    Procedure: INTRAMEDULLARY (IM) NAIL FEMORAL;  Surgeon: Johnn Hai, MD;  Location: [facility];  Service: Orthopedics;  Laterality: Left;  left hipintertrochanteric nail  (Biomet)  . Left heart catheterization with coronary angiogram N/A 10/08/2011    Procedure: LEFT HEART CATHETERIZATION WITH CORONARY ANGIOGRAM;  Surgeon: Hillary Bow, MD;  Location: [facility] CATH LAB;  Service: Cardiovascular;  Laterality: N/A;    Social History   Social History  . Marital Status: Widowed    Spouse Name: N/A  . Number of Children: N/A  . Years of Education: N/A   Occupational History  . RETIRED    Social History Main Topics  . Smoking status: Never Smoker   . Smokeless tobacco: Never Used  . Alcohol Use: No  . Drug Use: No  . Sexual Activity: No   Other Topics Concern  . Not on file   Social History Narrative     Filed Vitals:   01/25/15 1354  BP: 144/81  Pulse: 75  Height: 5\' 2"  (1.575 m)  Weight: 92 lb (41.731 kg)    PHYSICAL EXAM General: NAD, elderly, frail, thin build Neck: No JVD, no thyromegaly. Lungs: Clear to auscultation bilaterally with normal respiratory effort. CV: Nondisplaced PMI. Regular rate and rhythm, normal S1/S2, no S3/S4, soft I/VI pansystolic murmur along left sternal border. No pretibial or periankle edema. Abdomen: Soft, no distention.  Neurologic: Alert and oriented.  Psych: Normal affect. Skin: Normal. Musculoskeletal: No gross deformities. Extremities: No clubbing or cyanosis.   ECG: Most recent ECG reviewed.      ASSESSMENT AND PLAN: 1. CAD: Stable ischemic heart disease. Continue Imdur 60 mg daily and metoprolol. Given prior h/o elevated liver transaminases and due to age, will avoid statin therapy.  2. Essential HTN: Well controlled considering age. No changes.  3. Ischemic cardiomyopathy, EF  40-45%: No diuretic requirement at this time.  4. Feet cramps: Recommended magnesium supplements and other preventative measures.  Dispo: f/u 6 months.   Kate Sable, M.D., F.A.C.C.

## 2015-03-28 ENCOUNTER — Other Ambulatory Visit: Payer: Self-pay | Admitting: Cardiovascular Disease

## 2015-06-26 ENCOUNTER — Other Ambulatory Visit: Payer: Self-pay | Admitting: *Deleted

## 2015-06-26 MED ORDER — ISOSORBIDE MONONITRATE ER 60 MG PO TB24: 60.0000 mg | ORAL_TABLET | Freq: Every day | ORAL | Status: DC

## 2015-07-26 ENCOUNTER — Ambulatory Visit (INDEPENDENT_AMBULATORY_CARE_PROVIDER_SITE_OTHER): Payer: Medicare HMO | Admitting: Cardiovascular Disease

## 2015-07-26 ENCOUNTER — Encounter: Payer: Self-pay | Admitting: Cardiovascular Disease

## 2015-07-26 VITALS — BP 125/75 | HR 85 | Ht 62.0 in | Wt 93.0 lb

## 2015-07-26 DIAGNOSIS — I25118 Atherosclerotic heart disease of native coronary artery with other forms of angina pectoris: Secondary | ICD-10-CM | POA: Diagnosis not present

## 2015-07-26 DIAGNOSIS — I519 Heart disease, unspecified: Secondary | ICD-10-CM | POA: Diagnosis not present

## 2015-07-26 DIAGNOSIS — E782 Mixed hyperlipidemia: Secondary | ICD-10-CM | POA: Diagnosis not present

## 2015-07-26 DIAGNOSIS — I10 Essential (primary) hypertension: Secondary | ICD-10-CM | POA: Diagnosis not present

## 2015-07-26 NOTE — Progress Notes (Signed)
Patient ID: Kristen Rodriguez, female   DOB: July 08, 1923, 82 y.o.   MRN: [id number]      SUBJECTIVE: The patient returns for follow-up of coronary artery disease. She sustained an inferior MI in 2013. Coronary angiography on 10/08/11 demonstrated a calcified left main coronary artery with 20% distal narrowing, heavy calcification of the LAD with 40% stenosis before the diagonal take off and 50% segmented total stenosis thereafter. The diagonal was a large vessel with 50% proximal segmental plaque. The left circumflex was small with 30-50% segmental plaque proximally.   The RCA was a large caliber vessel with heavy calcification and an 80% proximal focal lesion which was stented. The mid vessel was heavily calcified with diffuse 50-60% calcified narrowing and did not appear favorable for intervention. A small acute marginal branch had a 90% stenosis. The first PDA branch was small and without narrowing. The second PDA was a medium size vessel and had an 80% proximal and 90% mid vessel stenosis for which she underwent PTCA.  Echocardiogram on 10/09/11 demonstrated mildly reduced left ventricular systolic function EF [id number], with mild hypokinesis of the entire inferior myocardium with grade 1 diastolic dysfunction.  Denies chest pain and shortness of breath.  ECG performed in the office today demonstrates normal sinus rhythm with no ischemic ST segment or T-wave abnormalities, nor any arrhythmias.    Review of Systems: As per "subjective", otherwise negative.  Allergies  Allergen Reactions  . Codeine Nausea Only  . Penicillins Other (See Comments)    unknown    Current Outpatient Prescriptions  Medication Sig Dispense Refill  . amLODipine (NORVASC) 5 MG tablet Take 5 mg by mouth every morning.     Marland Kitchen aspirin 81 MG tablet Take 81 mg by mouth every morning.     . calcium-vitamin D (OSCAL WITH D) 500-200 MG-UNIT per tablet Take 1 tablet by mouth every morning.     . docusate sodium (COLACE) 100 MG  capsule Take 100 mg by mouth at bedtime.     . isosorbide mononitrate (IMDUR) 60 MG 24 hr tablet Take 1 tablet (60 mg total) by mouth daily. 90 tablet 0  . Magnesium 250 MG TABS Take 1 tablet by mouth every other day.    . metoprolol tartrate (LOPRESSOR) 25 MG tablet TAKE 1 TABLET BY MOUTH EVERY DAY 30 tablet 6  . mirtazapine (REMERON) 15 MG tablet Take 15 mg by mouth at bedtime.    . nitroGLYCERIN (NITROSTAT) 0.4 MG SL tablet Place 0.4 mg under the tongue every 5 (five) minutes as needed for chest pain.    . polyethylene glycol powder (GLYCOLAX/MIRALAX) powder Take 17 g by mouth daily.    . potassium chloride (K-DUR,KLOR-CON) 10 MEQ tablet Take 1 tablet by mouth at bedtime.    . sulfamethoxazole-trimethoprim (BACTRIM,SEPTRA) 400-80 MG per tablet Take 1 tablet by mouth at bedtime.     No current facility-administered medications for this visit.    Past Medical History  Diagnosis Date  . Precordial pain   . Old myocardial infarction   . Hypopotassemia   . Esophageal reflux   . Allergic rhinitis, cause unspecified   . Pure hypercholesterolemia   . Unspecified essential hypertension   . Family history of ischemic heart disease   . CAD (coronary artery disease) 07/2000    Status Post NSTEMI, 60% mid RCA lesion treated medically  . Breast cancer ([place]) 2006  . DVT (deep venous thrombosis) ([place]) 2006  . Macular degeneration   . CHF (congestive heart failure) ([place])   .  H/O hiatal hernia     Past Surgical History  Procedure Laterality Date  . Lumbar laminectomy    . Cataract extraction, bilateral    . Abdominal hysterectomy    . Corneal implant      left  . Cardiac catheterization  2002    Without PCI  . Cardiac catheterization  10/08/2011    1.  Inferior MI with occlusion of the PLA with reperfusion with POBA, stent placed proximally RCA  for high grade disease  . Total hip arthroplasty  02/2010    Right hip by Nilda Calamity Polmeritis    . Colonoscopy  09/17/2005    Tubular adenoma  fragments  . Femur im nail  12/01/2011    Procedure: INTRAMEDULLARY (IM) NAIL FEMORAL;  Surgeon: Johnn Hai, MD;  Location: [facility];  Service: Orthopedics;  Laterality: Left;  left hipintertrochanteric nail  (Biomet)  . Left heart catheterization with coronary angiogram N/A 10/08/2011    Procedure: LEFT HEART CATHETERIZATION WITH CORONARY ANGIOGRAM;  Surgeon: Hillary Bow, MD;  Location: [facility] CATH LAB;  Service: Cardiovascular;  Laterality: N/A;    Social History   Social History  . Marital Status: Widowed    Spouse Name: N/A  . Number of Children: N/A  . Years of Education: N/A   Occupational History  . RETIRED    Social History Main Topics  . Smoking status: Never Smoker   . Smokeless tobacco: Never Used  . Alcohol Use: No  . Drug Use: No  . Sexual Activity: No   Other Topics Concern  . Not on file   Social History Narrative     Filed Vitals:   07/26/15 1402  BP: 125/75  Pulse: 85  Height: 5\' 2"  (1.575 m)  Weight: 93 lb (42.185 kg)    PHYSICAL EXAM General: NAD, elderly, frail, thin build Neck: No JVD, no thyromegaly. Lungs: Clear to auscultation bilaterally with normal respiratory effort. CV: Nondisplaced PMI. Regular rate and rhythm, normal S1/S2, no S3/S4, soft I/VI pansystolic murmur along left sternal border. No pretibial or periankle edema.  Abdomen: Soft, no distention.  Neurologic: Alert.  Psych: Normal affect.   ECG: Most recent ECG reviewed.      ASSESSMENT AND PLAN: 1. CAD: Stable ischemic heart disease. Continue Imdur 60 mg daily and metoprolol. Given prior h/o elevated liver transaminases and due to age, will avoid statin therapy.  2. Essential HTN: Well controlled. No changes.  3. Ischemic cardiomyopathy, EF 40-45%: No diuretic requirement at this time.  Dispo: fu 6 months.  Kate Sable, M.D., F.A.C.C.

## 2015-07-26 NOTE — Patient Instructions (Signed)
Your physician recommends that you continue on your current medications as directed. Please refer to the Current Medication list given to you today. Your physician recommends that you schedule a follow-up appointment in: 6 months. You will receive a reminder letter in the mail in about 4 months reminding you to call and schedule your appointment. If you don't receive this letter, please contact our office. 

## 2015-09-25 ENCOUNTER — Other Ambulatory Visit: Payer: Self-pay | Admitting: Cardiovascular Disease

## 2015-10-24 ENCOUNTER — Other Ambulatory Visit: Payer: Self-pay | Admitting: Cardiovascular Disease

## 2015-11-22 IMAGING — CT CT HEAD W/O CM
1 series · 16 of 30 positions shown, 20 images · non-contrast
Comparison: Prior CT from 06/02/2012

CLINICAL DATA: Found down

EXAM:
CT HEAD WITHOUT CONTRAST
TECHNIQUE: Contiguous axial images were obtained from the base of the skull
through the vertex without intravenous contrast.

[Series 2: headtrauma 4.8 h37s · axial · 0.46mm/px · z∈[-121,+39]mm · 16 of 36 slices shown, 20 images]
[im 2/36  brain]
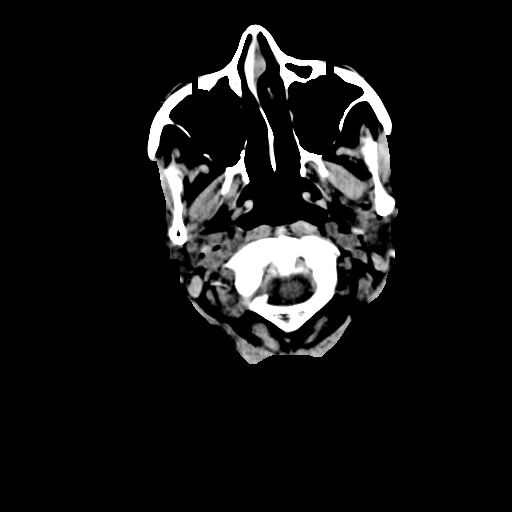
[im 2/36  bone]
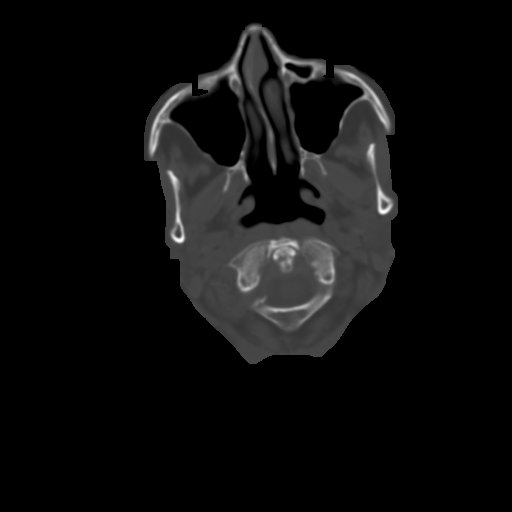
[im 4/36  brain]
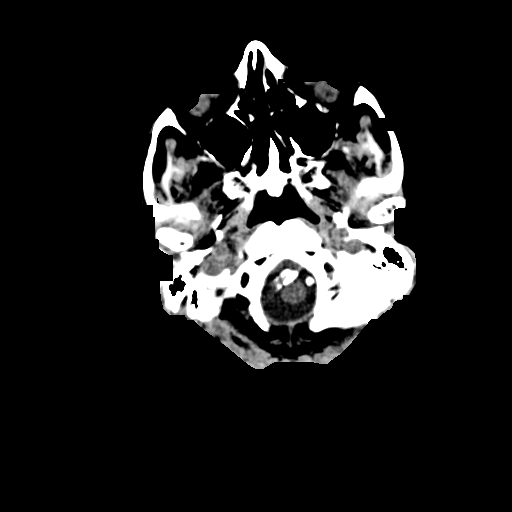
[im 7/36  brain]
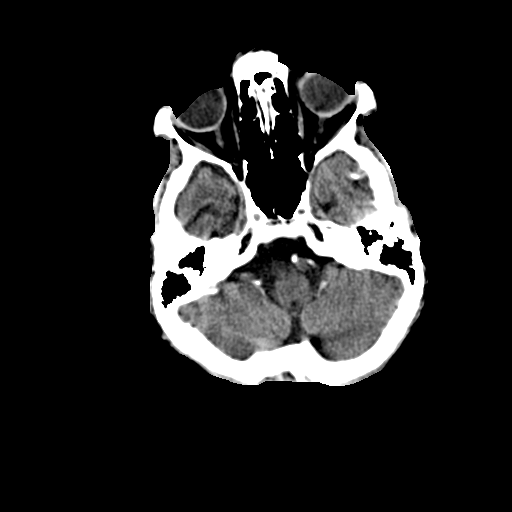
[im 9/36  brain]
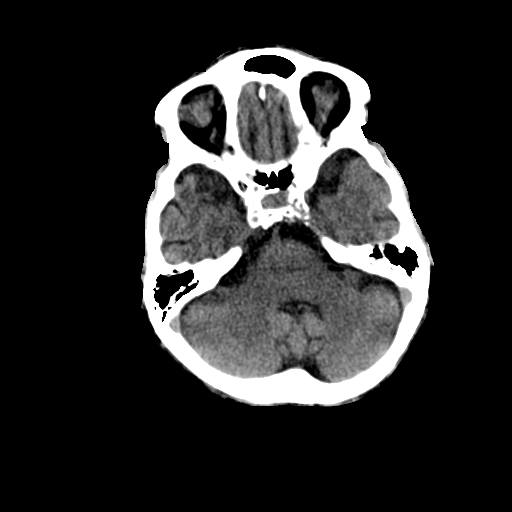
[im 10/36  brain]
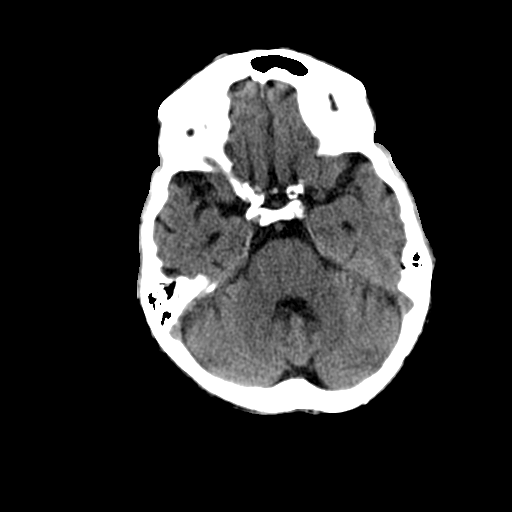
[im 10/36  bone]
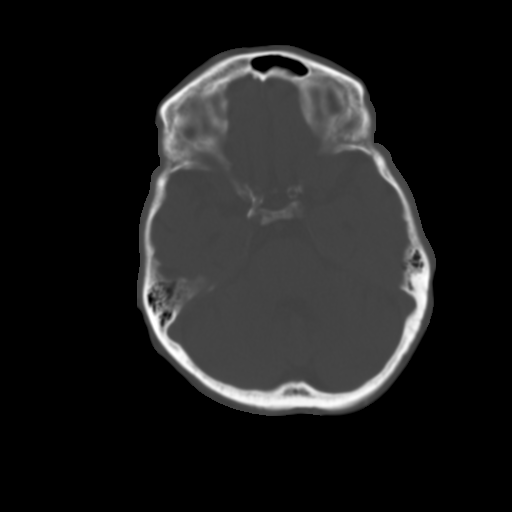
[im 13/36  brain]
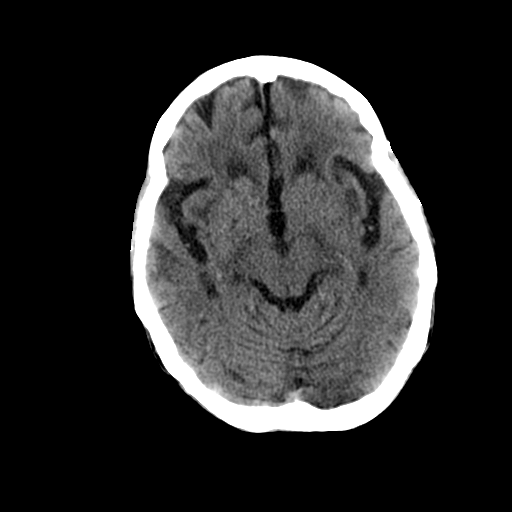
[im 15/36  brain]
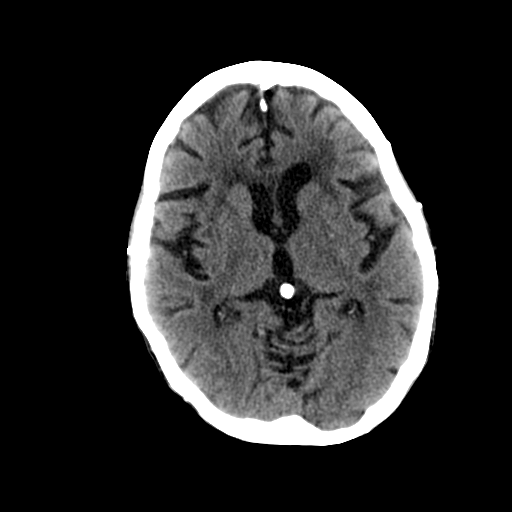
[im 17/36  brain]
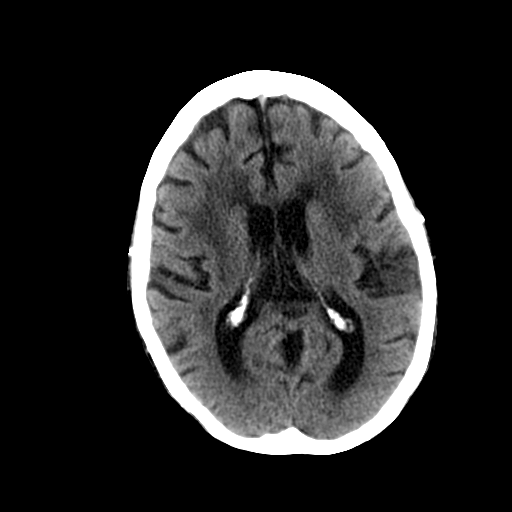
[im 19/36  brain]
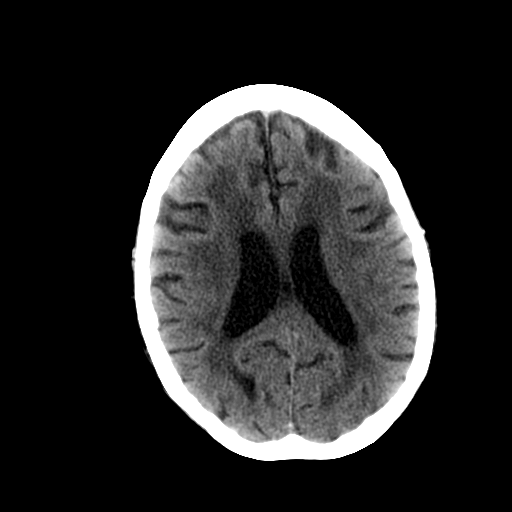
[im 19/36  bone]
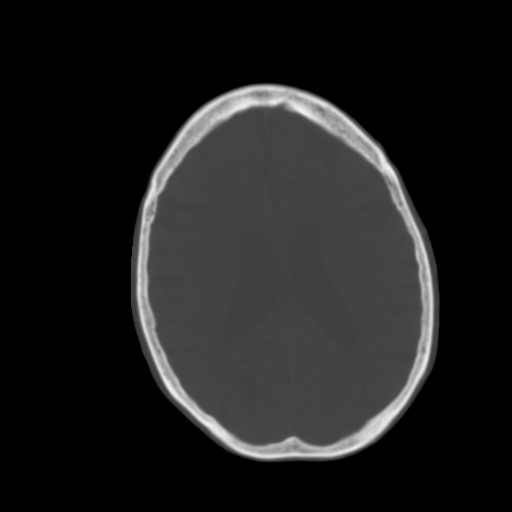
[im 21/36  brain]
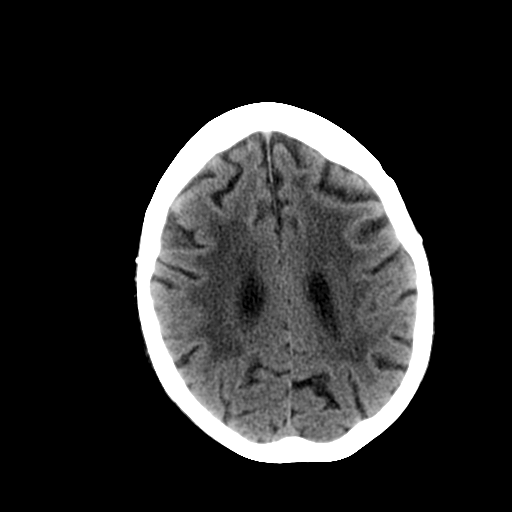
[im 23/36  brain]
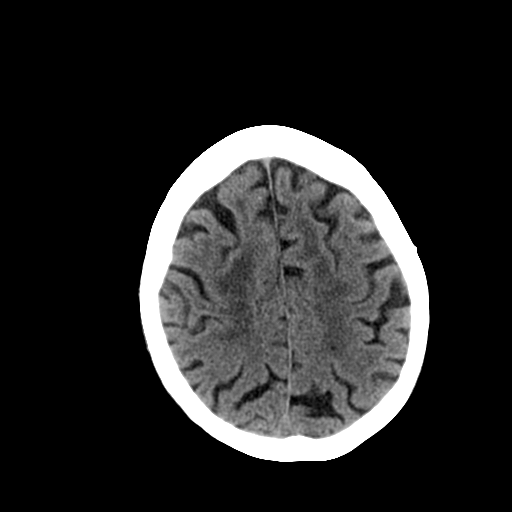
[im 26/36  brain]
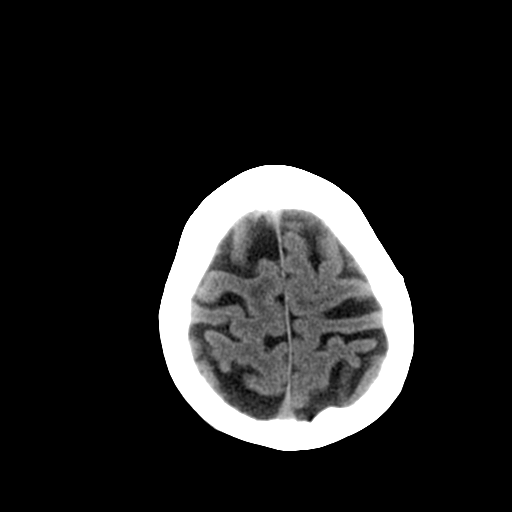
[im 27/36  brain]
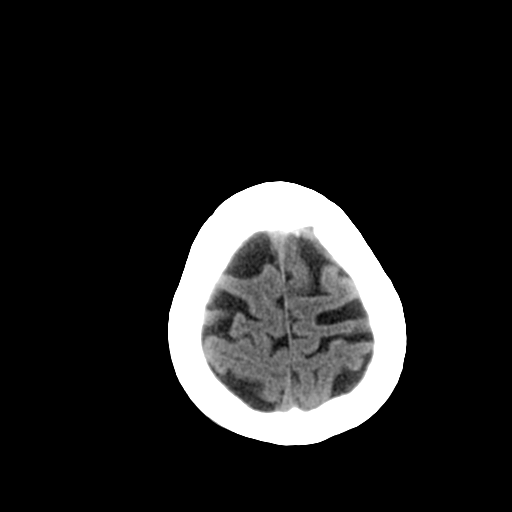
[im 27/36  bone]
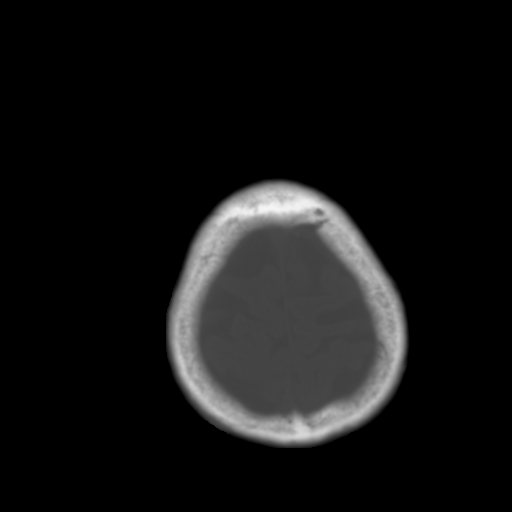
[im 29/36  brain]
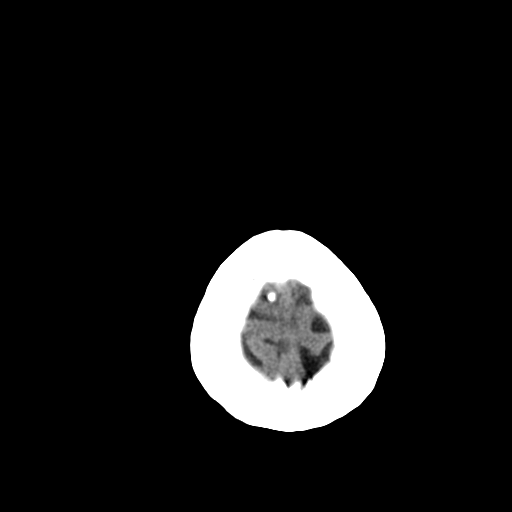
[im 32/36  brain]
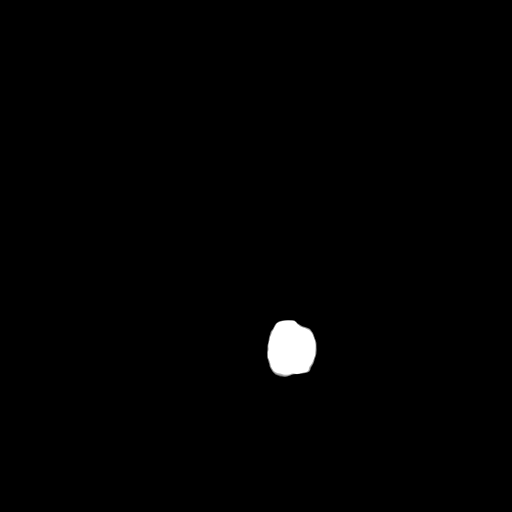
[im 34/36  brain]
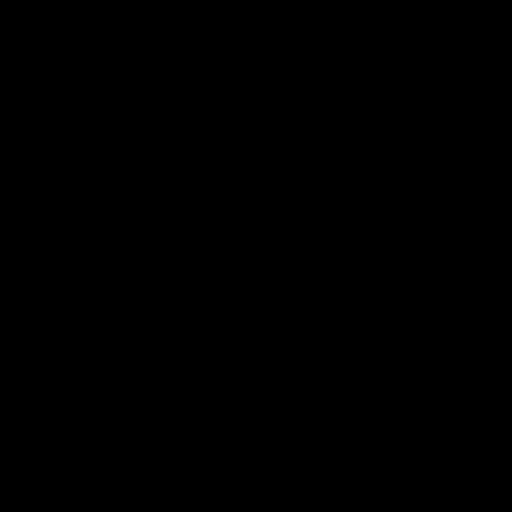

[16 of 30 positions shown; findings below may reference images not displayed]

FINDINGS: Advanced age-related atrophy with chronic microvascular ischemic
disease is stable relative to prior exam. Prominent vascular
calcifications noted within the carotid siphons and distal vertebral
arteries.

There is no acute intracranial hemorrhage or infarct. No mass lesion
or midline shift. Gray-white matter differentiation is well
maintained. Ventricles are normal in size without evidence of
hydrocephalus. No extra-axial fluid collection.

The calvarium is intact.

Orbital soft tissues are within normal limits. Bilateral lens
implants noted.

The paranasal sinuses and mastoid air cells are well pneumatized and
free of fluid.

Scalp soft tissues are unremarkable.
IMPRESSION: 1. No acute intracranial process identified.
2. Advanced age-related atrophy with chronic microvascular ischemic
disease, similar to prior.

## 2016-01-14 DEATH — deceased
